# Patient Record
Sex: Male | Born: 1965
Health system: Southern US, Community
[De-identification: ages and names within clinical notes are randomized; demographics above are authoritative.]

## PROBLEM LIST (undated history)

## (undated) ENCOUNTER — Emergency Department (HOSPITAL_BASED_OUTPATIENT_CLINIC_OR_DEPARTMENT_OTHER): Admission: EM | Payer: BC Managed Care – PPO | Source: Home / Self Care

## (undated) DIAGNOSIS — H05049 Tenonitis of unspecified orbit: Secondary | ICD-10-CM

## (undated) HISTORY — DX: Tenonitis of unspecified orbit: H05.049

## (undated) HISTORY — PX: EYE SURGERY: SHX253

---

## 2008-01-14 ENCOUNTER — Ambulatory Visit: Payer: Self-pay | Admitting: Family Medicine

## 2008-01-14 ENCOUNTER — Encounter: Admission: RE | Admit: 2008-01-14 | Discharge: 2008-01-14 | Payer: Self-pay | Admitting: Family Medicine

## 2008-01-14 DIAGNOSIS — M25519 Pain in unspecified shoulder: Secondary | ICD-10-CM

## 2008-01-14 LAB — CONVERTED CEMR LAB
AST: 22 units/L (ref 0–37)
Albumin: 4.5 g/dL (ref 3.5–5.2)
Alkaline Phosphatase: 61 units/L (ref 39–117)
BUN: 14 mg/dL (ref 6–23)
HDL: 55 mg/dL (ref >39)
LDL Cholesterol: 113 mg/dL — ABNORMAL HIGH (ref 0–99)
PSA: 0.72 ng/mL (ref 0.10–4.00)
Potassium: 4.8 meq/L (ref 3.5–5.3)
Sodium: 142 meq/L (ref 135–145)

## 2008-01-15 ENCOUNTER — Encounter: Payer: Self-pay | Admitting: Family Medicine

## 2008-12-01 ENCOUNTER — Ambulatory Visit: Payer: Self-pay | Admitting: Family Medicine

## 2008-12-01 DIAGNOSIS — R17 Unspecified jaundice: Secondary | ICD-10-CM | POA: Insufficient documentation

## 2008-12-01 DIAGNOSIS — K3189 Other diseases of stomach and duodenum: Secondary | ICD-10-CM

## 2008-12-01 DIAGNOSIS — R1013 Epigastric pain: Secondary | ICD-10-CM

## 2008-12-01 LAB — CONVERTED CEMR LAB
Nitrite: NEGATIVE
Specific Gravity, Urine: 1.025
WBC Urine, dipstick: NEGATIVE

## 2008-12-02 ENCOUNTER — Encounter: Payer: Self-pay | Admitting: Family Medicine

## 2008-12-02 DIAGNOSIS — R748 Abnormal levels of other serum enzymes: Secondary | ICD-10-CM | POA: Insufficient documentation

## 2008-12-02 LAB — CONVERTED CEMR LAB
ALT: 150 units/L — ABNORMAL HIGH (ref 0–53)
Alkaline Phosphatase: 131 units/L — ABNORMAL HIGH (ref 39–117)
Basophils Absolute: 0 10*3/uL (ref 0.0–0.1)
Basophils Relative: 0 % (ref 0–1)
MCHC: 34 g/dL (ref 30.0–36.0)
Neutro Abs: 3.2 10*3/uL (ref 1.7–7.7)
Neutrophils Relative %: 62 % (ref 43–77)
Platelets: 264 10*3/uL (ref 150–400)
RBC: 5.92 M/uL — ABNORMAL HIGH (ref 4.22–5.81)
RDW: 26.4 % — ABNORMAL HIGH (ref 11.5–15.5)
Sodium: 138 meq/L (ref 135–145)
Total Bilirubin: 7.3 mg/dL — ABNORMAL HIGH (ref 0.3–1.2)
Total Protein: 7.4 g/dL (ref 6.0–8.3)

## 2008-12-03 LAB — CONVERTED CEMR LAB
HCV Ab: NEGATIVE
Hep A IgM: NEGATIVE

## 2008-12-04 ENCOUNTER — Encounter: Admission: RE | Admit: 2008-12-04 | Discharge: 2008-12-04 | Payer: Self-pay | Admitting: Family Medicine

## 2008-12-08 ENCOUNTER — Telehealth: Payer: Self-pay | Admitting: Gastroenterology

## 2008-12-08 ENCOUNTER — Encounter: Payer: Self-pay | Admitting: Family Medicine

## 2008-12-09 LAB — CONVERTED CEMR LAB
Albumin: 4.3 g/dL (ref 3.5–5.2)
Alkaline Phosphatase: 157 units/L — ABNORMAL HIGH (ref 39–117)
Indirect Bilirubin: 3.4 mg/dL — ABNORMAL HIGH (ref 0.0–0.9)
Total Bilirubin: 7.8 mg/dL — ABNORMAL HIGH (ref 0.3–1.2)

## 2008-12-10 ENCOUNTER — Ambulatory Visit: Payer: Self-pay | Admitting: Gastroenterology

## 2008-12-10 DIAGNOSIS — K219 Gastro-esophageal reflux disease without esophagitis: Secondary | ICD-10-CM

## 2008-12-10 DIAGNOSIS — Z862 Personal history of diseases of the blood and blood-forming organs and certain disorders involving the immune mechanism: Secondary | ICD-10-CM | POA: Insufficient documentation

## 2008-12-10 DIAGNOSIS — Z8639 Personal history of other endocrine, nutritional and metabolic disease: Secondary | ICD-10-CM

## 2008-12-10 DIAGNOSIS — R718 Other abnormality of red blood cells: Secondary | ICD-10-CM

## 2008-12-10 DIAGNOSIS — L299 Pruritus, unspecified: Secondary | ICD-10-CM | POA: Insufficient documentation

## 2008-12-24 ENCOUNTER — Encounter: Payer: Self-pay | Admitting: Gastroenterology

## 2008-12-28 LAB — CONVERTED CEMR LAB
ALT: 76 units/L — ABNORMAL HIGH (ref 0–53)
Bilirubin, Direct: 3 mg/dL — ABNORMAL HIGH (ref 0.0–0.3)
Indirect Bilirubin: 2.6 mg/dL — ABNORMAL HIGH (ref 0.0–0.9)
Total Bilirubin: 5.6 mg/dL — ABNORMAL HIGH (ref 0.3–1.2)

## 2008-12-31 ENCOUNTER — Encounter: Payer: Self-pay | Admitting: Family Medicine

## 2009-01-04 LAB — CONVERTED CEMR LAB
ALT: 100 units/L — ABNORMAL HIGH (ref 0–53)
AST: 53 units/L — ABNORMAL HIGH (ref 0–37)
Albumin: 4.1 g/dL (ref 3.5–5.2)
Alkaline Phosphatase: 168 units/L — ABNORMAL HIGH (ref 39–117)
Bilirubin, Direct: 1.7 mg/dL — ABNORMAL HIGH (ref 0.0–0.3)
Indirect Bilirubin: 1.8 mg/dL — ABNORMAL HIGH (ref 0.0–0.9)
Total Bilirubin: 3.5 mg/dL — ABNORMAL HIGH (ref 0.3–1.2)
Total Protein: 7.1 g/dL (ref 6.0–8.3)

## 2009-01-05 ENCOUNTER — Ambulatory Visit: Payer: Self-pay | Admitting: Gastroenterology

## 2009-01-05 DIAGNOSIS — K759 Inflammatory liver disease, unspecified: Secondary | ICD-10-CM | POA: Insufficient documentation

## 2009-02-02 ENCOUNTER — Encounter: Payer: Self-pay | Admitting: Family Medicine

## 2009-02-03 LAB — CONVERTED CEMR LAB
ALT: 37 units/L (ref 0–53)
AST: 28 units/L (ref 0–37)
Albumin: 4.5 g/dL (ref 3.5–5.2)
Alkaline Phosphatase: 117 units/L (ref 39–117)
Total Bilirubin: 1.1 mg/dL (ref 0.3–1.2)
Total Protein: 7 g/dL (ref 6.0–8.3)

## 2009-02-16 ENCOUNTER — Ambulatory Visit: Payer: Self-pay | Admitting: Gastroenterology

## 2010-02-01 NOTE — Assessment & Plan Note (Signed)
Summary: F/U Elevated LFT's, saw NP   History of Present Illness Visit Type: Follow-up Visit Primary GI MD: Sheryn Bison MD FACP FAGA Primary Provider: Seymour Bars DO Requesting Provider: Seymour Bars DO Chief Complaint: abnormal liver function tests History of Present Illness:   This patient is a 45 year old African American male with cholestatic hepatitis felt secondary to use of protein supplements and perhaps testosterone steroid use for muscle building. His cholestasis has gradually improved as has his dark urine, clay-colored stools, and icterus. He continues with rather marked pruritis, and he could not tolerate pentazocine-naloxone. He also in the past he has taken omeprazole for acid reflux but denies reflux symptoms at this time and he says that he is taking no medications at all. He denies nausea vomiting, anorexia, weight loss, fever chills, muscle pains, metal status changes, or systemic complaints.  Upper abdominal ultrasound exam is unremarkable. Family history is noncontributory.  Repeat liver function tests have continued to improve and he  continues with slight elevation of transaminase levels. Lab data otherwise was reviewed today and is unremarkable.   GI Review of Systems    Reports acid reflux.      Denies abdominal pain, belching, bloating, chest pain, dysphagia with liquids, dysphagia with solids, heartburn, loss of appetite, nausea, vomiting, vomiting blood, weight loss, and  weight gain.        Denies anal fissure, black tarry stools, change in bowel habit, constipation, diarrhea, diverticulosis, fecal incontinence, heme positive stool, hemorrhoids, irritable bowel syndrome, jaundice, light color stool, liver problems, rectal bleeding, and  rectal pain.    Current Medications (verified): 1)  Mega Multi Men  Cr-Tabs (Multiple Vitamins-Minerals) .... Take 1 Tablet By Mouth Once A Day 2)  Digestive Advantage .Marland Kitchen.. 2 By Mouth Once Daily 3)  Omeprazole 20 Mg Cpdr  (Omeprazole) .... Take 1 Tab 30 Min Prior To Breakfast 4)  Pentazocine-Naloxone Hcl 50-0.5 Mg Tabs (Pentazocine-Naloxone) .... Take 1 Tab Twice Daily As Needed For Itching.  Allergies (verified): No Known Drug Allergies  Past History:  Past medical, surgical, family and social histories (including risk factors) reviewed for relevance to current acute and chronic problems.  Past Medical History: Reviewed history from 01/14/2008 and no changes required. AC tenonitis  Past Surgical History: Reviewed history from 01/14/2008 and no changes required. none  Family History: Reviewed history from 01/14/2008 and no changes required. mother, father and 2 sisters alive and healthy  Social History: Reviewed history from 01/14/2008 and no changes required. 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   Never smoked. Occas ETOH. Works out regularly, wt stable  Review of Systems       The patient complains of allergy/sinus and itching.  The patient denies anemia, anxiety-new, arthritis/joint pain, back pain, blood in urine, breast changes/lumps, change in vision, confusion, cough, coughing up blood, depression-new, fainting, fatigue, fever, headaches-new, hearing problems, heart murmur, heart rhythm changes, menstrual pain, muscle pains/cramps, night sweats, nosebleeds, pregnancy symptoms, shortness of breath, sleeping problems, sore throat, swelling of feet/legs, swollen lymph glands, thirst - excessive , urination - excessive , urination changes/pain, urine leakage, vision changes, and voice change.         Continued neuritis and decreased sex drive.  Vital Signs:  Patient profile:   45 year old male Height:      68.5 inches Weight:      206.50 pounds BMI:     31.05 Pulse rate:   68 / minute Pulse rhythm:   regular BP sitting:   124 /  72  (left arm) Cuff size:   regular  Vitals Entered By: June McMurray CMA Duncan Dull) (January 05, 2009 1:55 PM)  Physical Exam  General:  Well  developed, well nourished, no acute distress.healthy appearing.   Head:  Normocephalic and atraumatic. Eyes:  PERRLA, no icterus.he That has mild scleral icterus. Lungs:  Clear throughout to auscultation. Heart:  Regular rate and rhythm; no murmurs, rubs,  or bruits. Abdomen:  Soft, nontender and nondistended. No masses, hepatosplenomegaly or hernias noted. Normal bowel sounds. Extremities:  No clubbing, cyanosis, edema or deformities noted. Neurologic:  Alert and  oriented x4;  grossly normal neurologically. Psych:  Alert and cooperative. Normal mood and affect.   Impression & Recommendations:  Problem # 1:  UNSPECIFIED HEPATITIS (ICD-573.3) Assessment Improved His clinical course and history are all consistent with cholestatic hepatitis related to protein supplements as previously outlined. He continues to improve as does his cholestasis. I recommend a repeat liver function tests in one month with GI followup in 6 weeks. I've given him Atarax 25 mg to use a bedtime to help him with his pruritus. I agree that we should continue to avoid as many drugs as possible including acetaminophen.  Problem # 2:  PRURITUS (ICD-698.9) Assessment: Improved this will continue to improve hopefully as his icterus and cholestasis resolve  Problem # 3:  GERD (ICD-530.81) Assessment: Improved p.r.n. use of omeprazole as tolerated is acceptable.  Other Orders: Future Orders: TLB-Hepatic/Liver Function Pnl (80076-HEPATIC) ... 02/02/2009  Patient Instructions: 1)  Copy sent to : Dr. Seymour Bars 2)  Please schedule a follow-up appointment in 6 to 8 weeks. Atarax 25 mg at bedtime as tolerated for pruritis. 3)  Repeat labs in one month.  To be drawn at Dr. Ovidio Kin office. 4)  The medication list was reviewed and reconciled.  All changed / newly prescribed medications were explained.  A complete medication list was provided to the patient / caregiver. Prescriptions: VISTARIL 25 MG CAPS (HYDROXYZINE PAMOATE)  1 by mouth q hs  #30 x 2   Entered by:   Ashok Cordia RN   Authorized by:   Mardella Layman MD [facility]   Signed by:   Ashok Cordia RN on 01/05/2009   Method used:   Electronically to        [facility] [phone]* (retail)       [address] [place], [place]  [address]       Ph: [phone]       Fax: [phone]   RxID:   [id number]

## 2010-02-01 NOTE — Assessment & Plan Note (Signed)
Summary: 6 WEEK APPT...LSW.   History of Present Illness Visit Type: Follow-up Visit Primary GI MD: Sheryn Bison MD FACP FAGA Primary Provider: Seymour Bars, DO Requesting Provider: Seymour Bars DO Chief Complaint: Patient here fror 6 week f/u for increased LFT'S. He denies any GI symptoms at this time. History of Present Illness:   His liver function tests returned to normal and he is completely asymptomatic. Repeat enzymes reviewed from Dr. Cathey Endow. Patient denies using any further bodybuilding-protein supplements or over-the-counter medications.   GI Review of Systems      Denies abdominal pain, acid reflux, belching, bloating, chest pain, dysphagia with liquids, dysphagia with solids, heartburn, loss of appetite, nausea, vomiting, vomiting blood, weight loss, and  weight gain.        Denies anal fissure, black tarry stools, change in bowel habit, constipation, diarrhea, diverticulosis, fecal incontinence, heme positive stool, hemorrhoids, irritable bowel syndrome, jaundice, light color stool, liver problems, rectal bleeding, and  rectal pain.    Current Medications (verified): 1)  None  Allergies (verified): No Known Drug Allergies  Past History:  Past medical, surgical, family and social histories (including risk factors) reviewed for relevance to current acute and chronic problems.  Past Medical History: Reviewed history from 01/14/2008 and no changes required. AC tenonitis  Past Surgical History: Reviewed history from 01/14/2008 and no changes required. none  Family History: Reviewed history from 01/14/2008 and no changes required. mother, father and 2 sisters alive and healthy No FH of Colon Cancer:  Social History: Reviewed history from 01/14/2008 and no changes required. 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   Never smoked.  ETOH.-rare Works out regularly, wt stable  Review of Systems       The patient complains of allergy/sinus.  The  patient denies anemia, anxiety-new, arthritis/joint pain, back pain, blood in urine, breast changes/lumps, change in vision, confusion, cough, coughing up blood, depression-new, fainting, fatigue, fever, headaches-new, hearing problems, heart murmur, heart rhythm changes, itching, menstrual pain, muscle pains/cramps, night sweats, nosebleeds, pregnancy symptoms, shortness of breath, skin rash, sleeping problems, sore throat, swelling of feet/legs, swollen lymph glands, thirst - excessive , urination - excessive , urination changes/pain, urine leakage, vision changes, and voice change.    Vital Signs:  Patient profile:   45 year old male Height:      68.5 inches Weight:      2042 pounds BMI:     307.07 BSA:     5.52 Pulse rate:   80 / minute Pulse rhythm:   regular BP sitting:   94 / 58  (right arm)  Vitals Entered By: Hortense Ramal CMA Duncan Dull) (February 16, 2009 1:44 PM)  Physical Exam  General:  Well developed, well nourished, no acute distress.healthy appearing.   Head:  Normocephalic and atraumatic. Eyes:  PERRLA, no icterus.exam deferred to patient's ophthalmologist.   Abdomen:  Soft, nontender and nondistended. No masses, hepatosplenomegaly or hernias noted. Normal bowel sounds. Neurologic:  Alert and  oriented x4;  grossly normal neurologically. Psych:  Alert and cooperative. Normal mood and affect.   Impression & Recommendations:  Problem # 1:  UNSPECIFIED HEPATITIS (ICD-573.3) Assessment Improved Drug-Induced cholestatic hepatitis has completely resolved and he is asymptomatic, and there is no evidence of hepatosplenomegaly. I have cautioned him about using any medications without checking with his physicians beforehand. We will follow him with Dr. Cathey Endow have urged him to return to his normal activities and a healthy diet with a healthy lifestyle.  Patient Instructions: 1)  Copy sent  to : Dr. Seymour Bars and Wanda Plump nurse practitioner at [facility] 2)  Please  schedule a follow-up appointment as needed.

## 2010-08-08 ENCOUNTER — Encounter: Payer: Self-pay | Admitting: Family Medicine

## 2010-08-09 ENCOUNTER — Encounter: Payer: Self-pay | Admitting: Family Medicine

## 2010-08-09 ENCOUNTER — Ambulatory Visit (INDEPENDENT_AMBULATORY_CARE_PROVIDER_SITE_OTHER): Payer: BC Managed Care – PPO | Admitting: Family Medicine

## 2010-08-09 DIAGNOSIS — Z13220 Encounter for screening for lipoid disorders: Secondary | ICD-10-CM

## 2010-08-09 DIAGNOSIS — Z130 Encounter for screening for diseases of the blood and blood-forming organs and certain disorders involving the immune mechanism: Secondary | ICD-10-CM

## 2010-08-09 DIAGNOSIS — R5381 Other malaise: Secondary | ICD-10-CM

## 2010-08-09 DIAGNOSIS — R5383 Other fatigue: Secondary | ICD-10-CM

## 2010-08-09 LAB — CBC WITH DIFFERENTIAL/PLATELET
Basophils Relative: 1 % (ref 0–1)
Eosinophils Absolute: 0.1 10*3/uL (ref 0.0–0.7)
Eosinophils Relative: 3 % (ref 0–5)
HCT: 46.1 % (ref 39.0–52.0)
Hemoglobin: 14.9 g/dL (ref 13.0–17.0)
Lymphs Abs: 1.9 10*3/uL (ref 0.7–4.0)
MCH: 26.2 pg (ref 26.0–34.0)
MCHC: 32.3 g/dL (ref 30.0–36.0)
MCV: 81.2 fL (ref 78.0–100.0)
Monocytes Absolute: 0.2 10*3/uL (ref 0.1–1.0)
Monocytes Relative: 4 % (ref 3–12)
RBC: 5.68 MIL/uL (ref 4.22–5.81)

## 2010-08-09 LAB — LIPID PANEL
Cholesterol: 176 mg/dL (ref 0–200)
HDL: 53 mg/dL (ref >39)
Total CHOL/HDL Ratio: 3.3 Ratio

## 2010-08-09 NOTE — Progress Notes (Signed)
  Subjective:    Patient ID: Joel Perry, male    DOB: Aug 08, 1965, 45 y.o.   MRN: [id number]  HPI  45 yo AAM presents for f/u visit with fasting labs.  He had elevated liver enzymes last year but they normalized off supplements.  He feels more tired lately but is not getting adequate sleep.  His wife complains of his low sex drive.  He is exercising regularly.  No chest pain or SOB.  Denies feeling depressed or anxious.  He only needs TUMS every now and then for reflux but not everyday.  BP 110/72  Pulse 79  Ht 5\' 8"  (1.727 m)  Wt 216 lb (97.977 kg)  BMI 32.84 kg/m2  SpO2 97%   Review of Systems  Constitutional: Negative for fatigue and unexpected weight change.  Respiratory: Negative for shortness of breath.   Cardiovascular: Negative for chest pain, palpitations and leg swelling.  Genitourinary: Negative for difficulty urinating.  Psychiatric/Behavioral: Positive for sleep disturbance. Negative for dysphoric mood. The patient is not nervous/anxious.        Objective:   Physical Exam  Constitutional: He appears well-developed and well-nourished.  HENT:  Mouth/Throat: Oropharynx is clear and moist.  Eyes: No scleral icterus.  Neck: No thyromegaly present.  Cardiovascular: Normal rate, regular rhythm, normal heart sounds and intact distal pulses.   No murmur heard. Pulmonary/Chest: Effort normal and breath sounds normal.  Musculoskeletal: He exhibits no edema.  Skin: Skin is warm and dry.  Psychiatric: He has a normal mood and affect.          Assessment & Plan:  Fatigue/ low libido:  Due for fasting labs today.  Will include TSH and testosterone levels today and f/u results tomorrow.  Discussed the importance of adequate sleep. He is taking a MVI daily.

## 2010-08-09 NOTE — Patient Instructions (Signed)
Labs today. Will call you w/ results tomorrow.    Return for a PHYSICAL in 4 mos.

## 2010-08-10 LAB — COMPLETE METABOLIC PANEL WITHOUT GFR
ALT: 32 U/L (ref 0–53)
AST: 38 U/L — ABNORMAL HIGH (ref 0–37)
Albumin: 4.5 g/dL (ref 3.5–5.2)
Alkaline Phosphatase: 71 U/L (ref 39–117)
BUN: 22 mg/dL (ref 6–23)
CO2: 24 meq/L (ref 19–32)
Calcium: 9.5 mg/dL (ref 8.4–10.5)
Chloride: 102 meq/L (ref 96–112)
Creat: 1.29 mg/dL (ref 0.50–1.35)
GFR, Est African American: >60 mL/min (ref >60)
GFR, Est Non African American: >60 mL/min (ref >60)
Glucose, Bld: 91 mg/dL (ref 70–99)
Potassium: 4.2 meq/L (ref 3.5–5.3)
Sodium: 137 meq/L (ref 135–145)
Total Bilirubin: 0.6 mg/dL (ref 0.3–1.2)
Total Protein: 7.2 g/dL (ref 6.0–8.3)

## 2010-08-10 LAB — TESTOSTERONE, FREE, TOTAL, SHBG
Sex Hormone Binding: 24 nmol/L (ref 13–71)
Testosterone, Free: 107.6 pg/mL (ref 47.0–244.0)
Testosterone-% Free: 2.4 % (ref 1.6–2.9)
Testosterone: 440.45 ng/dL (ref 250–890)

## 2010-08-11 ENCOUNTER — Telehealth: Payer: Self-pay | Admitting: Family Medicine

## 2010-08-11 NOTE — Telephone Encounter (Signed)
Pt notified of results. KJ LPN 

## 2010-08-11 NOTE — Telephone Encounter (Signed)
Pt called and is wanting his results (labs). Plan:  Pt notified with the results. Jarvis Newcomer, LPN Domingo Dimes

## 2010-08-11 NOTE — Telephone Encounter (Signed)
Pls let pt know that his testosterone level came back perfect.  Thyroid function, blood counts, fasting sugar, liver and kidney function also normal.  Cholesterol is at goal.  Keep up the good work.  Cannot add testosterone RX because he is already normal.

## 2010-12-16 ENCOUNTER — Encounter: Payer: Self-pay | Admitting: Family Medicine

## 2010-12-16 ENCOUNTER — Ambulatory Visit (INDEPENDENT_AMBULATORY_CARE_PROVIDER_SITE_OTHER): Payer: BC Managed Care – PPO | Admitting: Family Medicine

## 2010-12-16 VITALS — BP 102/61 | HR 67 | Ht 69.5 in | Wt 218.0 lb

## 2010-12-16 DIAGNOSIS — R748 Abnormal levels of other serum enzymes: Secondary | ICD-10-CM

## 2010-12-16 DIAGNOSIS — Z0000 Encounter for general adult medical examination without abnormal findings: Secondary | ICD-10-CM

## 2010-12-16 DIAGNOSIS — Z23 Encounter for immunization: Secondary | ICD-10-CM

## 2010-12-16 DIAGNOSIS — R7402 Elevation of levels of lactic acid dehydrogenase (LDH): Secondary | ICD-10-CM

## 2010-12-16 NOTE — Progress Notes (Signed)
  Subjective:    Patient ID: Joel Perry, male    DOB: 01/25/1965, 45 y.o.   MRN: [id number]  HPI Here for CPE. No complaints.    Review of Systems comprehensiv eROS is neg  BP 102/61  Pulse 67  Ht 5' 9.5" (1.765 m)  Wt 218 lb (98.884 kg)  BMI 31.73 kg/m2    No Known Allergies  Past Medical History  Diagnosis Date  . Tenonitis     of AC    History reviewed. No pertinent past surgical history.  History   Social History  . Marital Status: Married    Spouse Name: Okey Regal    Number of Children: 2  . Years of Education: N/A   Occupational History  . 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    Social History Main Topics  . Smoking status: Never Smoker   . Smokeless tobacco: Not on file  . Alcohol Use: 0.0 oz/week    0 drink(s) per week     rare  . Drug Use: No  . Sexually Active: Not on file   Other Topics Concern  . Not on file   Social History Narrative   Works out regularly every other day.  Some caffeine.      History reviewed. No pertinent family history.     Objective:   Physical Exam  Constitutional: He is oriented to person, place, and time. He appears well-developed and well-nourished.  HENT:  Head: Normocephalic and atraumatic.  Right Ear: External ear normal.  Left Ear: External ear normal.  Nose: Nose normal.  Mouth/Throat: Oropharynx is clear and moist.  Eyes: Conjunctivae and EOM are normal. Pupils are equal, round, and reactive to light.  Neck: Normal range of motion. Neck supple. No thyromegaly present.  Cardiovascular: Normal rate, regular rhythm, normal heart sounds and intact distal pulses.   Pulmonary/Chest: Effort normal and breath sounds normal.  Abdominal: Soft. Bowel sounds are normal. He exhibits no distension and no mass. There is no tenderness. There is no rebound and no guarding.  Musculoskeletal: Normal range of motion.  Lymphadenopathy:    He has no cervical adenopathy.  Neurological: He is alert and oriented to person,  place, and time. He has normal reflexes.  Skin: Skin is warm and dry.  Psychiatric: He has a normal mood and affect. His behavior is normal. Judgment and thought content normal.          Assessment & Plan:  CPE- complete physical examination. Start a regular exercise program and make sure you are eating a healthy diet Try to eat 4 servings of dairy a day or take a calcium supplement (500mg  twice a day). Your vaccines are up to date.  Had labs in August. Will check irect LDL and liver enzymes. Had abnormal AST in August.  Tdap and flu vac given today.

## 2010-12-16 NOTE — Patient Instructions (Signed)
Keep up your regular exercise program and make sure you are eating a healthy diet Try to eat 4 servings of dairy a day or take a calcium supplement (500mg twice a day). Your vaccines are up to date.   

## 2010-12-22 LAB — HEPATIC FUNCTION PANEL
ALT: 28 U/L (ref 0–53)
AST: 31 U/L (ref 0–37)
Albumin: 4.6 g/dL (ref 3.5–5.2)
Alkaline Phosphatase: 70 U/L (ref 39–117)
Bilirubin, Direct: 0.1 mg/dL (ref 0.0–0.3)
Total Bilirubin: 0.5 mg/dL (ref 0.3–1.2)

## 2011-07-03 ENCOUNTER — Encounter: Payer: Self-pay | Admitting: Physician Assistant

## 2011-07-03 ENCOUNTER — Ambulatory Visit (INDEPENDENT_AMBULATORY_CARE_PROVIDER_SITE_OTHER): Payer: BC Managed Care – PPO | Admitting: Physician Assistant

## 2011-07-03 ENCOUNTER — Ambulatory Visit (INDEPENDENT_AMBULATORY_CARE_PROVIDER_SITE_OTHER): Payer: BC Managed Care – PPO

## 2011-07-03 VITALS — BP 126/77 | HR 73 | Ht 69.5 in | Wt 217.0 lb

## 2011-07-03 DIAGNOSIS — M25569 Pain in unspecified knee: Secondary | ICD-10-CM

## 2011-07-03 DIAGNOSIS — M25561 Pain in right knee: Secondary | ICD-10-CM

## 2011-07-03 MED ORDER — MELOXICAM 7.5 MG PO TABS: 7.5000 mg | ORAL_TABLET | Freq: Every day | ORAL | Status: DC

## 2011-07-03 NOTE — Progress Notes (Signed)
  Subjective:    Patient ID: Joel Perry, male    DOB: September 26, 1965, 46 y.o.   MRN: [id number]  HPI Pt has had right knee pain for 3 weeks. He can't remember any injury or time when pain started. He has heard no pop or click. He takes aleve but feels like it is not helping. He has not tried anything other than aleve. He can bear weight but sometimes feels like his leg is going to give way. Walking on it makes it feel worse. Resting makes it feel the best.     Review of Systems     Objective:   Physical Exam  Constitutional: He is oriented to person, place, and time. He appears well-developed and well-nourished.  Musculoskeletal:       Joint tenderness to palpation on medial side of right knee no pain with palpation of the lateral joint space. ROM is normal. MCMurrays there was pain on medial side but no click was heard. Anterior drawer was negative. Some swelling noted around patella.   Neurological: He is alert and oriented to person, place, and time.  Psychiatric: He has a normal mood and affect. His behavior is normal.          Assessment & Plan:  Right knee pain- Will get x-rays. Gave mobic to take daily for knee pain. Encouraged to ice and rest at night. Get a flexible knee brace to wear at all times. Gave handout on causes of knee pain. Discussed possibility of meniscus tears. Will get MRI to evaluate if pain is still present in a week.

## 2011-07-03 NOTE — Patient Instructions (Addendum)
Mobic daily. Ice for 20 minutes at a time. Will call with x-ray of right knee. Wear flexible brace. Will consider MRI if not improving in the next week.  Knee Pain The knee is the complex joint between your thigh and your lower leg. It is made up of bones, tendons, ligaments, and cartilage. The bones that make up the knee are:  The femur in the thigh.   The tibia and fibula in the lower leg.   The patella or kneecap riding in the groove on the lower femur.  CAUSES  Knee pain is a common complaint with many causes. A few of these causes are:  Injury, such as:   A ruptured ligament or tendon injury.   Torn cartilage.   Medical conditions, such as:   Gout   Arthritis   Infections   Overuse, over training or overdoing a physical activity.  Knee pain can be minor or severe. Knee pain can accompany debilitating injury. Minor knee problems often respond well to self-care measures or get well on their own. More serious injuries may need medical intervention or even surgery. SYMPTOMS The knee is complex. Symptoms of knee problems can vary widely. Some of the problems are:  Pain with movement and weight bearing.   Swelling and tenderness.   Buckling of the knee.   Inability to straighten or extend your knee.   Your knee locks and you cannot straighten it.   Warmth and redness with pain and fever.   Deformity or dislocation of the kneecap.  DIAGNOSIS  Determining what is wrong may be very straight forward such as when there is an injury. It can also be challenging because of the complexity of the knee. Tests to make a diagnosis may include:  Your caregiver taking a history and doing a physical exam.   Routine X-rays can be used to rule out other problems. X-rays will not reveal a cartilage tear. Some injuries of the knee can be diagnosed by:   Arthroscopy a surgical technique by which a small video camera is inserted through tiny incisions on the sides of the knee. This  procedure is used to examine and repair internal knee joint problems. Tiny instruments can be used during arthroscopy to repair the torn knee cartilage (meniscus).   Arthrography is a radiology technique. A contrast liquid is directly injected into the knee joint. Internal structures of the knee joint then become visible on X-ray film.   An MRI scan is a non x-ray radiology procedure in which magnetic fields and a computer produce two- or three-dimensional images of the inside of the knee. Cartilage tears are often visible using an MRI scanner. MRI scans have largely replaced arthrography in diagnosing cartilage tears of the knee.   Blood work.   Examination of the fluid that helps to lubricate the knee joint (synovial fluid). This is done by taking a sample out using a needle and a syringe.  TREATMENT The treatment of knee problems depends on the cause. Some of these treatments are:  Depending on the injury, proper casting, splinting, surgery or physical therapy care will be needed.   Give yourself adequate recovery time. Do not overuse your joints. If you begin to get sore during workout routines, back off. Slow down or do fewer repetitions.   For repetitive activities such as cycling or running, maintain your strength and nutrition.   Alternate muscle groups. For example if you are a weight lifter, work the upper body on one day and the  lower body the next.   Either tight or weak muscles do not give the proper support for your knee. Tight or weak muscles do not absorb the stress placed on the knee joint. Keep the muscles surrounding the knee strong.   Take care of mechanical problems.   If you have flat feet, orthotics or special shoes may help. See your caregiver if you need help.   Arch supports, sometimes with wedges on the inner or outer aspect of the heel, can help. These can shift pressure away from the side of the knee most bothered by osteoarthritis.   A brace called an  "unloader" brace also may be used to help ease the pressure on the most arthritic side of the knee.   If your caregiver has prescribed crutches, braces, wraps or ice, use as directed. The acronym for this is PRICE. This means protection, rest, ice, compression and elevation.   Nonsteroidal anti-inflammatory drugs (NSAID's), can help relieve pain. But if taken immediately after an injury, they may actually increase swelling. Take NSAID's with food in your stomach. Stop them if you develop stomach problems. Do not take these if you have a history of ulcers, stomach pain or bleeding from the bowel. Do not take without your caregiver's approval if you have problems with fluid retention, heart failure, or kidney problems.   For ongoing knee problems, physical therapy may be helpful.   Glucosamine and chondroitin are over-the-counter dietary supplements. Both may help relieve the pain of osteoarthritis in the knee. These medicines are different from the usual anti-inflammatory drugs. Glucosamine may decrease the rate of cartilage destruction.   Injections of a corticosteroid drug into your knee joint may help reduce the symptoms of an arthritis flare-up. They may provide pain relief that lasts a few months. You may have to wait a few months between injections. The injections do have a small increased risk of infection, water retention and elevated blood sugar levels.   Hyaluronic acid injected into damaged joints may ease pain and provide lubrication. These injections may work by reducing inflammation. A series of shots may give relief for as long as 6 months.   Topical painkillers. Applying certain ointments to your skin may help relieve the pain and stiffness of osteoarthritis. Ask your pharmacist for suggestions. Many over the-counter products are approved for temporary relief of arthritis pain.   In some countries, doctors often prescribe topical NSAID's for relief of chronic conditions such as  arthritis and tendinitis. A review of treatment with NSAID creams found that they worked as well as oral medications but without the serious side effects.  PREVENTION  Maintain a healthy weight. Extra pounds put more strain on your joints.   Get strong, stay limber. Weak muscles are a common cause of knee injuries. Stretching is important. Include flexibility exercises in your workouts.   Be smart about exercise. If you have osteoarthritis, chronic knee pain or recurring injuries, you may need to change the way you exercise. This does not mean you have to stop being active. If your knees ache after jogging or playing basketball, consider switching to swimming, water aerobics or other low-impact activities, at least for a few days a week. Sometimes limiting high-impact activities will provide relief.   Make sure your shoes fit well. Choose footwear that is right for your sport.   Protect your knees. Use the proper gear for knee-sensitive activities. Use kneepads when playing volleyball or laying carpet. Buckle your seat belt every time you drive. Most  shattered kneecaps occur in car accidents.   Rest when you are tired.  SEEK MEDICAL CARE IF:  You have knee pain that is continual and does not seem to be getting better.  SEEK IMMEDIATE MEDICAL CARE IF:  Your knee joint feels hot to the touch and you have a high fever. MAKE SURE YOU:   Understand these instructions.   Will watch your condition.   Will get help right away if you are not doing well or get worse.  Document Released: 10/16/2006 Document Revised: 12/08/2010 Document Reviewed: 10/16/2006 [facility] Patient Information 2012 [place], [place].

## 2011-08-15 ENCOUNTER — Encounter: Payer: Self-pay | Admitting: Family Medicine

## 2011-08-15 ENCOUNTER — Ambulatory Visit (INDEPENDENT_AMBULATORY_CARE_PROVIDER_SITE_OTHER): Payer: BC Managed Care – PPO | Admitting: Family Medicine

## 2011-08-15 VITALS — BP 116/68 | HR 74 | Temp 97.6°F | Wt 214.0 lb

## 2011-08-15 DIAGNOSIS — S335XXA Sprain of ligaments of lumbar spine, initial encounter: Secondary | ICD-10-CM

## 2011-08-15 DIAGNOSIS — S39012A Strain of muscle, fascia and tendon of lower back, initial encounter: Secondary | ICD-10-CM

## 2011-08-15 MED ORDER — OXYCODONE-ACETAMINOPHEN 7.5-500 MG PO TABS: 1.0000 | ORAL_TABLET | Freq: Every evening | ORAL | Status: DC | PRN

## 2011-08-15 MED ORDER — DICLOFENAC SODIUM 75 MG PO TBEC: 75.0000 mg | DELAYED_RELEASE_TABLET | Freq: Two times a day (BID) | ORAL | Status: DC | PRN

## 2011-08-15 MED ORDER — CYCLOBENZAPRINE HCL 10 MG PO TABS: 10.0000 mg | ORAL_TABLET | Freq: Three times a day (TID) | ORAL | Status: DC | PRN

## 2011-08-15 NOTE — Patient Instructions (Signed)

## 2011-08-15 NOTE — Progress Notes (Signed)
CC: Joel Perry is a 46 y.o. male is here for Back Pain   Subjective: HPI:  Physically active male who presents to clinic for back pain. He tells on Sunday he was lifting weights, doing his usual routine, and upwardly bent to the side to put any weight down and felt felt immediate pain/pull sensation in his left lower back. He has had identical pain before but typically it goes away within one to 2 days. He describes the pain is at 8/10 at its worse. He describes it as a discomfort that he can't find a position which will alleviate the pain 100 percent. He denies any motor or sensory disturbances, no bowel or bladder dysfunction, no trouble walking, no coordination issues, nor any midline back pain. Pain seems to be worse after bending forward when he is trying to stand back up straight. Has been interfering with his sleep at night. He's tried Aleve with mild improvement and also tried a 10 mg tablet of Flexeril without that much improvement however he believes Flexeril is years old and possibly expired. He denies any GI disturbance, dysuria, change in the color or consistency of his urine, abdominal pain, nor breathing issues,.   Review Of Systems Outlined In HPI  Past Medical History  Diagnosis Date  . Tenonitis     of AC     No family history on file.   History  Substance Use Topics  . Smoking status: Never Smoker   . Smokeless tobacco: Not on file  . Alcohol Use: 0.0 oz/week    0 drink(s) per week     rare     Objective: Filed Vitals:   08/15/11 1316  BP: 116/68  Pulse: 74  Temp: 97.6 F (36.4 C)    General: Alert and Oriented, No Acute Distress Lungs:   Comfortable work of breathing. Good air movement. Cardiac: Regular rate and rhythm.  Abdomen:soft and non tender without palpable masses. Extremities: No peripheral edema.  Strong peripheral pulses. Full ROM and strength in bilateral lower extremities.  L4  & S1 DTRs 1/4 bilaterally.  SLR causes slight pain but no  longer present when patient distracted.  FABER negative. Back: No midline spinal tenderness, slight pain with palpation of bilateral SI joints, pain worse with palpation of low lumbar paraspinal musculature.  Stork test negative bilaterally. Mental Status: No depression, anxiety, nor agitation. Skin: Warm and dry.  Assessment & Plan: Joel Perry was seen today for back pain.  Diagnoses and associated orders for this visit:  Low back strain - diclofenac (VOLTAREN) 75 MG EC tablet; Take 1 tablet (75 mg total) by mouth 2 (two) times daily as needed. - cyclobenzaprine (FLEXERIL) 10 MG tablet; Take 1 tablet (10 mg total) by mouth 3 (three) times daily as needed for muscle spasms. - oxyCODONE-acetaminophen (PERCOCET) 7.5-500 MG per tablet; Take 1 tablet by mouth at bedtime as needed for pain.  Other Orders - naproxen sodium (ANAPROX) 220 MG tablet; Take 220 mg by mouth 2 (two) times daily with a meal.    Reassured patient that bony pathology was unlikely given lack of retroflex. Patient was in agreement about getting x-rays. Suspect soft tissue sprain/strain. Encouraged to use heat as needed, trial of using his weight belt while up and about, diclofenac and Skelaxin (if tolerated) during the day and Percocet as needed at night to help with sleep, sedative risks discussed. Skelaxin samples given if this is more effective than Flexeril I will provide a prescription for Skelaxin otherwise focus on  Flexeril. Home exercise plan for back rehabilitation exercises given. Advised to stay away from weight lifting until pain free for 3-5 days, then focus on high reps low weights rather than former routine.Marland KitchenMarland KitchenSigns and symptoms requring emergent/urgent reevaluation were discussed with the patient.  Return 1 week for if not improving.  Requested Prescriptions   Signed Prescriptions Disp Refills  . diclofenac (VOLTAREN) 75 MG EC tablet 60 tablet 0    Sig: Take 1 tablet (75 mg total) by mouth 2 (two) times daily as  needed.  . cyclobenzaprine (FLEXERIL) 10 MG tablet 30 tablet 1    Sig: Take 1 tablet (10 mg total) by mouth 3 (three) times daily as needed for muscle spasms.  Marland Kitchen oxyCODONE-acetaminophen (PERCOCET) 7.5-500 MG per tablet 25 tablet 0    Sig: Take 1 tablet by mouth at bedtime as needed for pain.

## 2011-08-17 ENCOUNTER — Telehealth: Payer: Self-pay | Admitting: *Deleted

## 2011-08-17 MED ORDER — METAXALONE 800 MG PO TABS: 800.0000 mg | ORAL_TABLET | Freq: Three times a day (TID) | ORAL | Status: AC | PRN

## 2011-11-13 ENCOUNTER — Encounter: Payer: Self-pay | Admitting: Family Medicine

## 2011-11-13 ENCOUNTER — Ambulatory Visit (INDEPENDENT_AMBULATORY_CARE_PROVIDER_SITE_OTHER): Payer: BC Managed Care – PPO | Admitting: Family Medicine

## 2011-11-13 VITALS — BP 121/67 | HR 70 | Ht 69.0 in | Wt 213.0 lb

## 2011-11-13 DIAGNOSIS — M25561 Pain in right knee: Secondary | ICD-10-CM

## 2011-11-13 DIAGNOSIS — B079 Viral wart, unspecified: Secondary | ICD-10-CM

## 2011-11-13 DIAGNOSIS — M25569 Pain in unspecified knee: Secondary | ICD-10-CM

## 2011-11-13 NOTE — Patient Instructions (Addendum)
Can schedule an appontment with Dr. Benjamin Stain for her right knee if it's not improving.  If we need to repeat the freezing on your wart I recommend 2 weeks.

## 2011-11-13 NOTE — Progress Notes (Signed)
  Subjective:    Patient ID: Joel Perry, male    DOB: 05-08-1965, 46 y.o.   MRN: [id number]  HPI Right knee pain for years.  Had normal xrays in July. No known old injuries.  Says it does swell from time to time.  Sitting and then trying to stand up if painful.  Walking up downstairs or up stairs is very painful.  Pianful to kneel on it.  Did ice, elevated and took NSAIDs without significan relief.  Pain is on the medial and lateral side of the knee.  Says he says he hyperextended one of his knees years ago but can't member which one.  Wart on finger.  Left index finger.  Has been there for months. Had it frozen years ago and responded well.     Review of Systems     Objective:   Physical Exam  Constitutional: He appears well-developed and well-nourished.  HENT:  Head: Normocephalic and atraumatic.  Abdominal: Soft. Bowel sounds are normal.  Musculoskeletal:       Right Knee with trace edema. He's tender over the medial joint line. Nontender over the patella. Nontender over the patellar tendon. He does have some increased laxity with anterior sore on the right knee compared to left. No crepitus. Normal range of motion. Strength is 5 out of 5. Negative McMurray's.  Skin:       He has a small wart on the proximal index finger on the left hand.  Psychiatric: He has a normal mood and affect. His behavior is normal.      Assessment and plan:  Right knee pain-I. suspect chondromalacia based on exam and history today. Especially with pain going down stairs and pain when he has been sitting for long periods or standing for long periods. It also swells intermittently. Given handout on exercises to start a home. Can also retry an anti-inflammatory he would like. He says today's x-ray today for his knee. It is not noticing some progressive the next 2-3 weeks I encouraged him to follow with my partner Dr. Benjamin Stain for further evaluation and treatment options.  [facility]- Discussed tx  options.  He opted for cryotherapy. Liquid nitrogen applied today patient tolerated well.  Cryotherapy Procedure Note  Pre-operative Diagnosis: wart  Post-operative Diagnosis: same  Locations: left hand  index finger  Indications: Irritated  Anesthesia: not required   Procedure Details  Patient informed of risks (permanent scarring, infection, light or dark discoloration, bleeding, infection, weakness, numbness and recurrence of the lesion) and benefits of the procedure and verbal informed consent obtained.  The areas are treated with liquid nitrogen therapy, frozen until ice ball extended 2 mm beyond lesion, allowed to thaw, and treated again. The patient tolerated procedure well.  The patient was instructed on post-op care, warned that there may be blister formation, redness and pain. Recommend OTC analgesia as needed for pain.  Condition: Stable  Complications: none.  Plan: 1. Instructed to keep the area dry and covered for 24-48h and clean thereafter. 2. Warning signs of infection were reviewed.   3. Recommended that the patient use OTC ibuprofen as needed for pain.  4. Return prn.

## 2012-07-30 ENCOUNTER — Ambulatory Visit (INDEPENDENT_AMBULATORY_CARE_PROVIDER_SITE_OTHER): Payer: BC Managed Care – PPO | Admitting: Family Medicine

## 2012-07-30 ENCOUNTER — Ambulatory Visit (INDEPENDENT_AMBULATORY_CARE_PROVIDER_SITE_OTHER): Payer: BC Managed Care – PPO

## 2012-07-30 ENCOUNTER — Encounter: Payer: Self-pay | Admitting: Family Medicine

## 2012-07-30 VITALS — BP 127/81 | HR 83 | Wt 224.0 lb

## 2012-07-30 DIAGNOSIS — M545 Low back pain, unspecified: Secondary | ICD-10-CM

## 2012-07-30 DIAGNOSIS — M25561 Pain in right knee: Secondary | ICD-10-CM

## 2012-07-30 DIAGNOSIS — M25569 Pain in unspecified knee: Secondary | ICD-10-CM

## 2012-07-30 MED ORDER — CYCLOBENZAPRINE HCL 10 MG PO TABS: 10.0000 mg | ORAL_TABLET | Freq: Every evening | ORAL | Status: DC | PRN

## 2012-07-30 MED ORDER — MELOXICAM 15 MG PO TABS: 15.0000 mg | ORAL_TABLET | Freq: Every day | ORAL | Status: DC

## 2012-07-30 NOTE — Progress Notes (Signed)
Subjective:    Patient ID: Joel Perry, male    DOB: July 12, 1965, 47 y.o.   MRN: [id number]  HPI Right knee pain for years. Had normal xrays in July. No known old injuries. Says it does swell from time to time. Sitting and then trying to stand up if painful. Walking up downstairs or up stairs is very painful. Pianful to kneel on it. Did ice, elevated and took NSAIDs without significan relief. Pain is on the medial and lateral side of the knee. Says he says he hyperextended  His right knee years ago while in the [employer] and had a tear of one of the tendons and a calf strain. We dx him with chondromalcia about 9 months ago and given exercise and NSAID.  Nto really much better. Still can't sit or stand for a long period. Will occ swell on the inner knee. Occ gives out on him.  Occ pops and sometimes painful.    Low Back pain on and off for over a years. HAs been since was in the [employer].  Flares occasionally.  sometimes just wakes up and is stiif and painful and will get muscle spasms.  Hx of 2 bulges discs. occ radiates into the left leg but rare.  Using Aleve occ but doesn't really help.  xrays were years ago.     Review of Systems BP 127/81  Pulse 83  Wt 224 lb (101.606 kg)  BMI 33.06 kg/m2    No Known Allergies  Past Medical History  Diagnosis Date  . Tenonitis     of AC    No past surgical history on file.  History   Social History  . Marital Status: Married    Spouse Name: Okey Regal    Number of Children: 2  . Years of Education: N/A   Occupational History  . 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    Social History Main Topics  . Smoking status: Never Smoker   . Smokeless tobacco: Not on file  . Alcohol Use: 0.0 oz/week    0 drink(s) per week     Comment: rare  . Drug Use: No  . Sexually Active: Not on file   Other Topics Concern  . Not on file   Social History Narrative   Works out regularly every other day.  Some caffeine.      No family history on  file.  Outpatient Encounter Prescriptions as of 07/30/2012  Medication Sig Dispense Refill  . cyclobenzaprine (FLEXERIL) 10 MG tablet Take 1 tablet (10 mg total) by mouth at bedtime as needed for muscle spasms.  30 tablet  0  . meloxicam (MOBIC) 15 MG tablet Take 1 tablet (15 mg total) by mouth daily.  30 tablet  1  . [DISCONTINUED] cyclobenzaprine (FLEXERIL) 10 MG tablet Take 1 tablet (10 mg total) by mouth 3 (three) times daily as needed for muscle spasms.  30 tablet  1  . [DISCONTINUED] diclofenac (VOLTAREN) 75 MG EC tablet Take 1 tablet (75 mg total) by mouth 2 (two) times daily as needed.  60 tablet  0  . [DISCONTINUED] naproxen sodium (ANAPROX) 220 MG tablet Take 220 mg by mouth 2 (two) times daily with a meal.      . [DISCONTINUED] oxyCODONE-acetaminophen (PERCOCET) 7.5-500 MG per tablet Take 1 tablet by mouth at bedtime as needed for pain.  25 tablet  0   No facility-administered encounter medications on file as of 07/30/2012.  Objective:   Physical Exam  Constitutional: He appears well-developed and well-nourished.  HENT:  Head: Normocephalic and atraumatic.  Musculoskeletal:  Right knee with no significant swelling. He does have some tenderness just above the medial joint line on the right knee. He has significant pain in that same area with full flexion. Hip, knee, ankle strength is 5 out of 5 bilaterally. Patellar reflexes 1+ bilaterally. Lumbar spine with decreased flexion. He has decreased mobility the lumbar spine with flexion. Also decreased extension. Decreased rotation right and left but symmetric. And fairly normal side bending right and left. Nontender over the lumbar spine but he is mildly tender over the paraspinous muscles bilaterally. No SI joint tenderness. Negative straight leg raise bilaterally.  He does have increased laxity with anterior drawer of the knee on the right compared to the left.  Skin: Skin is warm and dry.  Psychiatric: He has a normal mood  and affect. His behavior is normal.          Assessment & Plan:  Right knee Pain - Will refer to Dr. Karie Schwalbe for further evluation and tx.  is actually tender just above the joint lines of it's possible he has some tenderness over the medial collateral ligament. He does have some increased laxity is from an old hyperextension injury. He did have x-rays done last November that were fairly normal except for some edema. I would like him to see my partner for further evaluation and treatment.  Acute on chronic low back pain - recommend trial of muscle relaxers at bedtime. We'll change his anti-inflammatory to me that. He sees diclofenac and naproxen in the past. Offered to give him a small quantity of hydrocodone but he declined today. We will go ahead and get x-rays at the last week in [place] was several years ago. He definitely has some limited flexibility of the lumbar spine. This can be due muscle spasm versus arthritis versus his herniated discs. May need further evaluation with MRI if not continuing to improve.

## 2012-07-30 NOTE — Patient Instructions (Addendum)
Please schedule appt with Dr Karie Schwalbe.

## 2012-08-02 ENCOUNTER — Ambulatory Visit (INDEPENDENT_AMBULATORY_CARE_PROVIDER_SITE_OTHER): Payer: BC Managed Care – PPO | Admitting: Sports Medicine

## 2012-08-02 ENCOUNTER — Encounter: Payer: Self-pay | Admitting: Sports Medicine

## 2012-08-02 VITALS — BP 109/72 | HR 70 | Wt 224.0 lb

## 2012-08-02 DIAGNOSIS — M545 Low back pain, unspecified: Secondary | ICD-10-CM

## 2012-08-02 DIAGNOSIS — M25561 Pain in right knee: Secondary | ICD-10-CM

## 2012-08-02 DIAGNOSIS — M1711 Unilateral primary osteoarthritis, right knee: Secondary | ICD-10-CM | POA: Insufficient documentation

## 2012-08-02 DIAGNOSIS — M47816 Spondylosis without myelopathy or radiculopathy, lumbar region: Secondary | ICD-10-CM | POA: Insufficient documentation

## 2012-08-02 DIAGNOSIS — M25569 Pain in unspecified knee: Secondary | ICD-10-CM

## 2012-08-02 MED ORDER — PREDNISONE 50 MG PO TABS: ORAL_TABLET | ORAL | Status: DC

## 2012-08-02 MED ORDER — TRAMADOL HCL 50 MG PO TABS: 50.0000 mg | ORAL_TABLET | Freq: Three times a day (TID) | ORAL | Status: DC | PRN

## 2012-08-02 NOTE — Assessment & Plan Note (Signed)
After an injury years ago, now with a loose anterior cruciate ligament, as well as likely meniscal injury, and subsequent osteoarthritis. X-rays are negative, we do need an MRI. Knee brace, tramadol, home exercises. If no better in a month, we will inject the knee.

## 2012-08-02 NOTE — Progress Notes (Signed)
   Subjective:    I'm seeing this patient as a consultation for:  Dr. Linford Arnold  CC: Back pain and knee pain  HPI: Right knee pain: This is a very pleasant 47 year old male, he injured his knee decades ago. Since then he's had pain he localizes along the medial joint line associated with swelling, gelling, popping, but no locking or buckling. He really hasn't used any oral medications for this and hasn't been in the rehabilitation, he did have x-rays, the results of which will be dictated below. Pain is moderate, persistent.  Low back pain: Occasionally radiates down the lateral and posterior aspect of the left leg, worse when standing up straight, worse with Valsalva, better with leaning forward as when pushing a shopping cart. Not so bad when sitting in a car. No bowel or bladder dysfunction, no saddle anesthesia, no constitutional symptoms.  Past medical history, Surgical history, Family history not pertinant except as noted below, Social history, Allergies, and medications have been entered into the medical record, reviewed, and no changes needed.   Review of Systems: No headache, visual changes, nausea, vomiting, diarrhea, constipation, dizziness, abdominal pain, skin rash, fevers, chills, night sweats, weight loss, swollen lymph nodes, body aches, joint swelling, muscle aches, chest pain, shortness of breath, mood changes, visual or auditory hallucinations.   Objective:   General: Well Developed, well nourished, and in no acute distress.  Neuro/Psych: Alert and oriented x3, extra-ocular muscles intact, able to move all 4 extremities, sensation grossly intact. Skin: Warm and dry, no rashes noted.  Respiratory: Not using accessory muscles, speaking in full sentences, trachea midline.  Cardiovascular: Pulses palpable, no extremity edema. Abdomen: Does not appear distended. Right Knee: Minimal visible swelling, tender to palpation at the medial joint line. Some pain with terminal  flexion. ROM full in flexion and extension and lower leg rotation. Ligaments with solid consistent endpoints including PCL, LCL, MCL. There does feel to be loose endpoint on anterior cruciate ligament testing. Negative Mcmurray's, Apley's, and Thessalonian tests. Non painful patellar compression. Patellar glide without crepitus. Patellar and quadriceps tendons unremarkable. Hamstring and quadriceps strength is normal.  Back Exam:  Inspection: Unremarkable  Motion: Flexion 45 deg, Extension 45 deg, Side Bending to 45 deg bilaterally,  Rotation to 45 deg bilaterally  SLR laying: Negative  XSLR laying: Negative  Palpable tenderness: None. FABER: negative. Sensory change: Gross sensation intact to all lumbar and sacral dermatomes.  Reflexes: 2+ at both patellar tendons, 2+ at achilles tendons, Babinski's downgoing.  Strength at foot  Plantar-flexion: 5/5 Dorsi-flexion: 5/5 Eversion: 5/5 Inversion: 5/5  Leg strength  Quad: 5/5 Hamstring: 5/5 Hip flexor: 5/5 Hip abductors: 5/5  Gait unremarkable.  Knee x-rays are unremarkable with the exception of an effusion..  Lumbar spine x-rays are overall unremarkable as well.  Impression and Recommendations:   This case required medical decision making of moderate complexity.

## 2012-08-02 NOTE — Assessment & Plan Note (Signed)
Symptoms are suspicious for lumbar spinal stenosis. Prednisone, Flexeril at bedtime, tramadol as needed. Formal physical therapy. Return in 4 weeks, MRI for interventional injection planning if no better.

## 2012-08-05 ENCOUNTER — Telehealth: Payer: Self-pay | Admitting: *Deleted

## 2012-08-05 NOTE — Telephone Encounter (Signed)
Prior auth obtained for MRI right knee w/o contrast through [employer].  Auth # [id number] valid from 8/4 to 09/03/2012.  Fleet Contras notified in imaging in [place]. Barry Dienes, LPN

## 2012-08-10 ENCOUNTER — Ambulatory Visit (HOSPITAL_BASED_OUTPATIENT_CLINIC_OR_DEPARTMENT_OTHER)
Admission: RE | Admit: 2012-08-10 | Discharge: 2012-08-10 | Disposition: A | Discharge status: Home / Self Care | Payer: BC Managed Care – PPO | Source: Ambulatory Visit | Attending: Sports Medicine | Admitting: Sports Medicine

## 2012-08-10 DIAGNOSIS — M25561 Pain in right knee: Secondary | ICD-10-CM

## 2012-08-10 DIAGNOSIS — M25569 Pain in unspecified knee: Secondary | ICD-10-CM | POA: Insufficient documentation

## 2012-08-12 ENCOUNTER — Ambulatory Visit: Payer: BC Managed Care – PPO | Admitting: Physical Therapy

## 2012-08-12 DIAGNOSIS — M545 Low back pain: Secondary | ICD-10-CM

## 2012-08-12 DIAGNOSIS — M2560 Stiffness of unspecified joint, not elsewhere classified: Secondary | ICD-10-CM

## 2012-08-12 DIAGNOSIS — M6281 Muscle weakness (generalized): Secondary | ICD-10-CM

## 2012-08-15 ENCOUNTER — Encounter: Payer: BC Managed Care – PPO | Admitting: Physical Therapy

## 2012-08-15 DIAGNOSIS — M545 Low back pain: Secondary | ICD-10-CM

## 2012-08-15 DIAGNOSIS — M2560 Stiffness of unspecified joint, not elsewhere classified: Secondary | ICD-10-CM

## 2012-08-15 DIAGNOSIS — M6281 Muscle weakness (generalized): Secondary | ICD-10-CM

## 2012-08-19 ENCOUNTER — Encounter: Payer: BC Managed Care – PPO | Admitting: Physical Therapy

## 2012-08-19 DIAGNOSIS — M6281 Muscle weakness (generalized): Secondary | ICD-10-CM

## 2012-08-19 DIAGNOSIS — M2560 Stiffness of unspecified joint, not elsewhere classified: Secondary | ICD-10-CM

## 2012-08-19 DIAGNOSIS — M545 Low back pain: Secondary | ICD-10-CM

## 2012-08-22 ENCOUNTER — Encounter: Payer: BC Managed Care – PPO | Admitting: Physical Therapy

## 2012-08-22 DIAGNOSIS — M6281 Muscle weakness (generalized): Secondary | ICD-10-CM

## 2012-08-22 DIAGNOSIS — M2560 Stiffness of unspecified joint, not elsewhere classified: Secondary | ICD-10-CM

## 2012-08-22 DIAGNOSIS — M545 Low back pain, unspecified: Secondary | ICD-10-CM

## 2012-08-26 ENCOUNTER — Encounter (INDEPENDENT_AMBULATORY_CARE_PROVIDER_SITE_OTHER): Payer: Self-pay | Admitting: Physical Therapy

## 2012-08-26 DIAGNOSIS — M545 Low back pain, unspecified: Secondary | ICD-10-CM

## 2012-08-26 DIAGNOSIS — M6281 Muscle weakness (generalized): Secondary | ICD-10-CM

## 2012-08-26 DIAGNOSIS — M2560 Stiffness of unspecified joint, not elsewhere classified: Secondary | ICD-10-CM

## 2012-08-30 ENCOUNTER — Ambulatory Visit (INDEPENDENT_AMBULATORY_CARE_PROVIDER_SITE_OTHER): Payer: BC Managed Care – PPO | Admitting: Sports Medicine

## 2012-08-30 ENCOUNTER — Encounter: Payer: Self-pay | Admitting: Sports Medicine

## 2012-08-30 VITALS — BP 108/72 | HR 73 | Wt 219.0 lb

## 2012-08-30 DIAGNOSIS — M545 Low back pain, unspecified: Secondary | ICD-10-CM

## 2012-08-30 DIAGNOSIS — M25561 Pain in right knee: Secondary | ICD-10-CM

## 2012-08-30 DIAGNOSIS — M25569 Pain in unspecified knee: Secondary | ICD-10-CM

## 2012-08-30 NOTE — Progress Notes (Signed)
  Subjective:    CC: Follow up  HPI: Knee pain: To recap, this started after an injury decades ago, we treated him conservatively, and then obtained an MRI. The MRI results are dictated below. He still has a little pain that he localizes over the front of his knee, worse when going up and down stairs. He denies any mechanical symptoms, or any feelings of instability. He declined any interventional treatment today.  Low back pain: Worse when standing up straight, no radiation, overall better with physical therapy and conservative measures.  Past medical history, Surgical history, Family history not pertinant except as noted below, Social history, Allergies, and medications have been entered into the medical record, reviewed, and no changes needed.   Review of Systems: No fevers, chills, night sweats, weight loss, chest pain, or shortness of breath.   Objective:    General: Well Developed, well nourished, and in no acute distress.  Neuro: Alert and oriented x3, extra-ocular muscles intact, sensation grossly intact.  HEENT: Normocephalic, atraumatic, pupils equal round reactive to light, neck supple, no masses, no lymphadenopathy, thyroid nonpalpable.  Skin: Warm and dry, no rashes. Cardiac: Regular rate and rhythm, no murmurs rubs or gallops, no lower extremity edema.  Respiratory: Clear to auscultation bilaterally. Not using accessory muscles, speaking in full sentences. Right Knee: Normal to inspection with no erythema or effusion or obvious bony abnormalities. Palpation normal with no warmth, joint line tenderness, patellar tenderness, or condyle tenderness. ROM full in flexion and extension and lower leg rotation. Ligaments with solid consistent endpoints including ACL, PCL, LCL, MCL. Negative Mcmurray's, Apley's, and Thessalonian tests. Non painful patellar compression. Patellar glide without crepitus. Patellar and quadriceps tendons unremarkable. Hamstring and quadriceps strength is  normal.   MRI shows partial anterior cruciate ligament tear as well as arthritis.  Impression and Recommendations:

## 2012-08-30 NOTE — Assessment & Plan Note (Signed)
Symptoms do likely represent lumbar spinal stenosis. He is doing a little better with physical therapy, and has several sessions left, I propose that we revisit this in about 6 weeks before getting any more aggressive.

## 2012-08-30 NOTE — Assessment & Plan Note (Signed)
MRI shows arthritis and partial anterior cruciate ligament tear. His knee is stable, and with a partial tear I would not consider reconstruction. Certainly we could try an injection, for now he prefers to rehabilitation the knee which I think is entirely appropriate. I like to see him back in about 6 weeks to see how things are going.

## 2012-10-10 ENCOUNTER — Encounter: Payer: Self-pay | Admitting: Sports Medicine

## 2012-10-10 ENCOUNTER — Ambulatory Visit (INDEPENDENT_AMBULATORY_CARE_PROVIDER_SITE_OTHER): Payer: BC Managed Care – PPO | Admitting: Sports Medicine

## 2012-10-10 VITALS — BP 113/72 | HR 86 | Wt 220.0 lb

## 2012-10-10 DIAGNOSIS — M25569 Pain in unspecified knee: Secondary | ICD-10-CM

## 2012-10-10 DIAGNOSIS — M25561 Pain in right knee: Secondary | ICD-10-CM

## 2012-10-10 NOTE — Progress Notes (Signed)
  Subjective:    CC: Followup  HPI: Right knee pain: This is a very pleasant 47 year old male with known knee osteoarthritis. He has been through physical therapy, NSAIDs, pain relief has been insufficient. Pain is localized under the kneecap and the joint lines in the right knee, without swelling or mechanical symptoms. It is worsening, does not radiate, moderate.  Past medical history, Surgical history, Family history not pertinant except as noted below, Social history, Allergies, and medications have been entered into the medical record, reviewed, and no changes needed.   Review of Systems: No fevers, chills, night sweats, weight loss, chest pain, or shortness of breath.   Objective:    General: Well Developed, well nourished, and in no acute distress.  Neuro: Alert and oriented x3, extra-ocular muscles intact, sensation grossly intact.  HEENT: Normocephalic, atraumatic, pupils equal round reactive to light, neck supple, no masses, no lymphadenopathy, thyroid nonpalpable.  Skin: Warm and dry, no rashes. Cardiac: Regular rate and rhythm, no murmurs rubs or gallops, no lower extremity edema.  Respiratory: Clear to auscultation bilaterally. Not using accessory muscles, speaking in full sentences. Right Knee: Normal to inspection with no erythema or effusion or obvious bony abnormalities. Tender to palpation at the joint lines. ROM full in flexion and extension and lower leg rotation. Ligaments with solid consistent endpoints including ACL, PCL, LCL, MCL. Negative Mcmurray's, Apley's, and Thessalonian tests. Non painful patellar compression. Patellar glide without crepitus. Patellar and quadriceps tendons unremarkable. Hamstring and quadriceps strength is normal.   Procedure: Real-time Ultrasound Guided Injection of right knee Device: GE Logiq E  Verbal informed consent obtained.  Time-out conducted.  Noted no overlying erythema, induration, or other signs of local infection.  Skin  prepped in a sterile fashion.  Local anesthesia: Topical Ethyl chloride.  With sterile technique and under real time ultrasound guidance:  2 cc Kenalog 40, 4 cc lidocaine injected easily into the suprapatellar recess. Completed without difficulty  Pain immediately resolved suggesting accurate placement of the medication.  Advised to call if fevers/chills, erythema, induration, drainage, or persistent bleeding.  Images permanently stored and available for review in the ultrasound unit.  Impression: Technically successful ultrasound guided injection.  Impression and Recommendations:

## 2012-10-10 NOTE — Assessment & Plan Note (Signed)
Right knee osteoarthritis, injected as above. Return in one month.

## 2012-11-06 ENCOUNTER — Encounter: Payer: Self-pay | Admitting: Family Medicine

## 2012-11-06 ENCOUNTER — Telehealth: Payer: Self-pay | Admitting: *Deleted

## 2012-11-06 NOTE — Telephone Encounter (Signed)
Pt called and lvm informing that letter is up front ready for p/u.Loralee Pacas [employer]

## 2012-11-07 ENCOUNTER — Ambulatory Visit: Payer: BC Managed Care – PPO | Admitting: Sports Medicine

## 2012-11-08 ENCOUNTER — Encounter: Payer: Self-pay | Admitting: Sports Medicine

## 2012-11-08 ENCOUNTER — Ambulatory Visit (INDEPENDENT_AMBULATORY_CARE_PROVIDER_SITE_OTHER): Payer: BC Managed Care – PPO | Admitting: Sports Medicine

## 2012-11-08 VITALS — BP 116/66 | HR 64 | Wt 220.0 lb

## 2012-11-08 DIAGNOSIS — M75101 Unspecified rotator cuff tear or rupture of right shoulder, not specified as traumatic: Secondary | ICD-10-CM | POA: Insufficient documentation

## 2012-11-08 DIAGNOSIS — M67919 Unspecified disorder of synovium and tendon, unspecified shoulder: Secondary | ICD-10-CM

## 2012-11-08 DIAGNOSIS — M25561 Pain in right knee: Secondary | ICD-10-CM

## 2012-11-08 MED ORDER — MELOXICAM 15 MG PO TABS: ORAL_TABLET | ORAL | Status: DC

## 2012-11-08 NOTE — Progress Notes (Signed)
  Subjective:    CC: Follow up  HPI: Knee osteoarthritis: MRI did show chronic anterior cruciate ligament tear as well as osteoarthritis, pain is essentially resolved after injection.  Right shoulder pain: Present for years, worse over the deltoid, worse with overhead activities, occasional popping. Moderate, persistent, radiation.  Past medical history, Surgical history, Family history not pertinant except as noted below, Social history, Allergies, and medications have been entered into the medical record, reviewed, and no changes needed.   Review of Systems: No fevers, chills, night sweats, weight loss, chest pain, or shortness of breath.   Objective:    General: Well Developed, well nourished, and in no acute distress.  Neuro: Alert and oriented x3, extra-ocular muscles intact, sensation grossly intact.  HEENT: Normocephalic, atraumatic, pupils equal round reactive to light, neck supple, no masses, no lymphadenopathy, thyroid nonpalpable.  Skin: Warm and dry, no rashes. Cardiac: Regular rate and rhythm, no murmurs rubs or gallops, no lower extremity edema.  Respiratory: Clear to auscultation bilaterally. Not using accessory muscles, speaking in full sentences. Right Shoulder: Inspection reveals no abnormalities, atrophy or asymmetry. Palpation is normal with no tenderness over AC joint or bicipital groove. ROM is full in all planes. Rotator cuff strength normal throughout. Positive Neer's, Hawkins, and empty can sign. Speeds and Yergason's tests normal. No labral pathology noted with negative Obrien's, negative clunk and good stability. Normal scapular function observed. No painful arc and no drop arm sign. No apprehension sign Right Knee: Normal to inspection with no erythema or effusion or obvious bony abnormalities. Palpation normal with no warmth, joint line tenderness, patellar tenderness, or condyle tenderness. ROM full in flexion and extension and lower leg  rotation. Ligaments with solid consistent endpoints including ACL, PCL, LCL, MCL. Negative Mcmurray's, Apley's, and Thessalonian tests. Non painful patellar compression. Patellar glide without crepitus. Patellar and quadriceps tendons unremarkable. Hamstring and quadriceps strength is normal.  Impression and Recommendations:

## 2012-11-08 NOTE — Assessment & Plan Note (Signed)
Pain has essentially resolved after injection. He does have a chronic anterior cruciate ligament tear as well as osteoarthritis. He can see me back on an as needed basis for this, and if at least 3 months have past since the last injection we can consider another steroid injection, otherwise he would become a candidate for Visco supplementation.

## 2012-11-08 NOTE — Assessment & Plan Note (Signed)
Mobic, on rehabilitation. X-rays. Return in 4 weeks, consider injection if no better.

## 2012-12-06 ENCOUNTER — Ambulatory Visit (INDEPENDENT_AMBULATORY_CARE_PROVIDER_SITE_OTHER): Payer: BC Managed Care – PPO | Admitting: Sports Medicine

## 2012-12-06 ENCOUNTER — Encounter: Payer: Self-pay | Admitting: Sports Medicine

## 2012-12-06 VITALS — BP 115/71 | HR 59 | Wt 221.0 lb

## 2012-12-06 DIAGNOSIS — M75101 Unspecified rotator cuff tear or rupture of right shoulder, not specified as traumatic: Secondary | ICD-10-CM

## 2012-12-06 DIAGNOSIS — M67919 Unspecified disorder of synovium and tendon, unspecified shoulder: Secondary | ICD-10-CM

## 2012-12-06 MED ORDER — DICLOFENAC SODIUM 75 MG PO TBEC: 75.0000 mg | DELAYED_RELEASE_TABLET | Freq: Two times a day (BID) | ORAL | Status: DC

## 2012-12-06 NOTE — Progress Notes (Signed)
  Subjective:    CC: Followup  HPI: Right shoulder pain: Clinically rotator cuff dysfunction, has done very well with conservative measures and pain is much improved, he does have some bad days. Is not waking him up from sleep, he does not desire injection therapy. He has not yet taken any NSAIDs.  Past medical history, Surgical history, Family history not pertinant except as noted below, Social history, Allergies, and medications have been entered into the medical record, reviewed, and no changes needed.   Review of Systems: No fevers, chills, night sweats, weight loss, chest pain, or shortness of breath.   Objective:    General: Well Developed, well nourished, and in no acute distress.  Neuro: Alert and oriented x3, extra-ocular muscles intact, sensation grossly intact.  HEENT: Normocephalic, atraumatic, pupils equal round reactive to light, neck supple, no masses, no lymphadenopathy, thyroid nonpalpable.  Skin: Warm and dry, no rashes. Cardiac: Regular rate and rhythm, no murmurs rubs or gallops, no lower extremity edema.  Respiratory: Clear to auscultation bilaterally. Not using accessory muscles, speaking in full sentences. Right Shoulder: Inspection reveals no abnormalities, atrophy or asymmetry. Palpation is normal with no tenderness over AC joint or bicipital groove. ROM is full in all planes. Rotator cuff strength normal throughout. No signs of impingement with negative Neer and Hawkin's tests, empty can sign. Speeds and Yergason's tests normal. No labral pathology noted with negative Obrien's, negative clunk and good stability. Normal scapular function observed. No painful arc and no drop arm sign. No apprehension sign  Impression and Recommendations:

## 2012-12-06 NOTE — Assessment & Plan Note (Signed)
Improved significantly since last visit. He does feel as though he has plateaued. He is not taking an NSAID, I am going to call in diclofenac per patient request, he will continue on rehabilitation exercises, and return in about a month. If he continues to have occasional pain, I will inject the subacromial bursa.

## 2013-01-03 ENCOUNTER — Ambulatory Visit: Payer: BC Managed Care – PPO | Admitting: Sports Medicine

## 2013-01-06 ENCOUNTER — Ambulatory Visit (INDEPENDENT_AMBULATORY_CARE_PROVIDER_SITE_OTHER): Payer: Managed Care, Other (non HMO) | Admitting: Sports Medicine

## 2013-01-06 ENCOUNTER — Encounter: Payer: Self-pay | Admitting: Sports Medicine

## 2013-01-06 VITALS — BP 109/66 | HR 72 | Wt 223.0 lb

## 2013-01-06 DIAGNOSIS — M719 Bursopathy, unspecified: Secondary | ICD-10-CM

## 2013-01-06 DIAGNOSIS — M75101 Unspecified rotator cuff tear or rupture of right shoulder, not specified as traumatic: Secondary | ICD-10-CM

## 2013-01-06 DIAGNOSIS — M67919 Unspecified disorder of synovium and tendon, unspecified shoulder: Secondary | ICD-10-CM

## 2013-01-06 DIAGNOSIS — M25569 Pain in unspecified knee: Secondary | ICD-10-CM

## 2013-01-06 DIAGNOSIS — M25561 Pain in right knee: Secondary | ICD-10-CM

## 2013-01-06 NOTE — Assessment & Plan Note (Signed)
Did extremely well after injection 2 months ago. Continues to do well. Return as needed for this, if less than 3 months after her last injection we would proceed with Visco supplementation.

## 2013-01-06 NOTE — Assessment & Plan Note (Signed)
Continues to improve, Joel Perry does desire to pursue further noninvasive rehabilitation measures which I think is appropriate. He can come back to see me on an as needed basis for this.

## 2013-01-06 NOTE — Progress Notes (Signed)
  Subjective:    CC: Followup  HPI: Right-sided rotator cuff dysfunction: Continues to improve with conservative measures, he still has some bad days but desires to further his workouts, and avoid intervention right now.  Right knee pain: History of anterior cruciate ligament reconstruction and subsequent osteoarthritis, much better after injection.  Past medical history, Surgical history, Family history not pertinant except as noted below, Social history, Allergies, and medications have been entered into the medical record, reviewed, and no changes needed.   Review of Systems: No fevers, chills, night sweats, weight loss, chest pain, or shortness of breath.   Objective:    General: Well Developed, well nourished, and in no acute distress.  Neuro: Alert and oriented x3, extra-ocular muscles intact, sensation grossly intact.  HEENT: Normocephalic, atraumatic, pupils equal round reactive to light, neck supple, no masses, no lymphadenopathy, thyroid nonpalpable.  Skin: Warm and dry, no rashes. Cardiac: Regular rate and rhythm, no murmurs rubs or gallops, no lower extremity edema.  Respiratory: Clear to auscultation bilaterally. Not using accessory muscles, speaking in full sentences. Right Shoulder: Inspection reveals no abnormalities, atrophy or asymmetry. Palpation is normal with no tenderness over AC joint or bicipital groove. ROM is full in all planes. Rotator cuff strength normal throughout. No signs of impingement with negative Neer and Hawkin's tests, empty can sign. Speeds and Yergason's tests normal. No labral pathology noted with negative Obrien's, negative clunk and good stability. Normal scapular function observed. No painful arc and no drop arm sign. No apprehension sign Right Knee: Normal to inspection with no erythema or effusion or obvious bony abnormalities. Palpation normal with no warmth, joint line tenderness, patellar tenderness, or condyle tenderness. ROM full in  flexion and extension and lower leg rotation. Ligaments with solid consistent endpoints including ACL, PCL, LCL, MCL. Negative Mcmurray's, Apley's, and Thessalonian tests. Non painful patellar compression. Patellar glide without crepitus. Patellar and quadriceps tendons unremarkable. Hamstring and quadriceps strength is normal.   Impression and Recommendations:

## 2013-01-06 NOTE — Patient Instructions (Signed)
Look into Viscosupplementation/Supartz injections for knee arthritis.

## 2013-02-14 ENCOUNTER — Encounter: Payer: Self-pay | Admitting: Sports Medicine

## 2013-03-14 ENCOUNTER — Ambulatory Visit (INDEPENDENT_AMBULATORY_CARE_PROVIDER_SITE_OTHER): Payer: Managed Care, Other (non HMO) | Admitting: Family Medicine

## 2013-03-14 ENCOUNTER — Encounter: Payer: Self-pay | Admitting: Family Medicine

## 2013-03-14 VITALS — BP 106/67 | HR 69 | Ht 68.0 in | Wt 224.0 lb

## 2013-03-14 DIAGNOSIS — R21 Rash and other nonspecific skin eruption: Secondary | ICD-10-CM

## 2013-03-14 NOTE — Progress Notes (Signed)
   Subjective:    Patient ID: OTHA MONICAL, male    DOB: 04-24-1965, 48 y.o.   MRN: [id number]  HPI Patient noticed a couple spots on his left upper arm on Sunday, approximately 5 or 6 days ago. He says occasionally is itchy but otherwise not bothersome. It's not painful. He had his wife check his skin and she did not notice any other lesions on him. He does have one on the left posterior shoulder. He denies any new soaps lotions or perfumes. He does not wear anything over that particular arm during work out at [employer] etc.   Review of Systems     Objective:   Physical Exam  Constitutional: He appears well-developed and well-nourished.  HENT:  Head: Normocephalic and atraumatic.  Skin: Skin is warm and dry. Rash noted.  Dry macular hyperpigmented lesions on the left upper arm. He has about 4 or 5. They are between half a centimeter and 1 cm in size.  Psychiatric: He has a normal mood and affect. His behavior is normal.          Assessment & Plan:  Rash on left upper arm-eczema versus tinea versicolor. Skin scraping performed. We'll call with the results on Monday. Explained to him that this can be treated topically pending on which diagnosis is correct. We will call him with results as it is possible.

## 2013-03-14 NOTE — Addendum Note (Signed)
Addended by: Teddy Spike on: 03/14/2013 01:30 PM   Modules accepted: Orders

## 2013-03-16 LAB — KOH PREP: RESULT - KOH: NONE SEEN

## 2013-03-17 ENCOUNTER — Other Ambulatory Visit: Payer: Self-pay | Admitting: Family Medicine

## 2013-03-17 MED ORDER — TRIAMCINOLONE ACETONIDE 0.5 % EX OINT: 1.0000 "application " | TOPICAL_OINTMENT | Freq: Every day | CUTANEOUS | Status: DC

## 2013-07-03 ENCOUNTER — Ambulatory Visit (INDEPENDENT_AMBULATORY_CARE_PROVIDER_SITE_OTHER): Payer: Managed Care, Other (non HMO) | Admitting: Sports Medicine

## 2013-07-03 ENCOUNTER — Encounter: Payer: Self-pay | Admitting: Sports Medicine

## 2013-07-03 VITALS — BP 114/73 | HR 92 | Ht 68.0 in | Wt 220.0 lb

## 2013-07-03 DIAGNOSIS — I8390 Asymptomatic varicose veins of unspecified lower extremity: Secondary | ICD-10-CM

## 2013-07-03 DIAGNOSIS — M1710 Unilateral primary osteoarthritis, unspecified knee: Secondary | ICD-10-CM

## 2013-07-03 DIAGNOSIS — M1711 Unilateral primary osteoarthritis, right knee: Secondary | ICD-10-CM

## 2013-07-03 MED ORDER — AMBULATORY NON FORMULARY MEDICATION: Status: DC

## 2013-07-03 NOTE — Assessment & Plan Note (Signed)
Still doing okay after injection last year, and diclofenac twice a day. Next up would be viscous supplementation. He will think about this and let us know.

## 2013-07-03 NOTE — Assessment & Plan Note (Signed)
Mostly right-sided. Recommended lower extremity compression stockings. Return as needed, we can certainly consider thermal ablation in the future.

## 2013-07-03 NOTE — Patient Instructions (Signed)
Varicose Veins Varicose veins are veins that have become enlarged and twisted. CAUSES This condition is the result of valves in the veins not working properly. Valves in the veins help return blood from the leg to the heart. If these valves are damaged, blood flows backwards and backs up into the veins in the leg near the skin. This causes the veins to become larger. People who are on their feet a lot, who are pregnant, or who are overweight are more likely to develop varicose veins. SYMPTOMS   Bulging, twisted-appearing, bluish veins, most commonly found on the legs.  Leg pain or a feeling of heaviness. These symptoms may be worse at the end of the day.  Leg swelling.  Skin color changes. DIAGNOSIS  Varicose veins can usually be diagnosed with an exam of your legs by your caregiver. He or she may recommend an ultrasound of your leg veins. TREATMENT  Most varicose veins can be treated at home.However, other treatments are available for people who have persistent symptoms or who want to treat the cosmetic appearance of the varicose veins. These include:  Laser treatment of very small varicose veins.  Medicine that is shot (injected) into the vein. This medicine hardens the walls of the vein and closes off the vein. This treatment is called sclerotherapy. Afterwards, you may need to wear clothing or bandages that apply pressure.  Surgery. HOME CARE INSTRUCTIONS   Do not stand or sit in one position for long periods of time. Do not sit with your legs crossed. Rest with your legs raised during the day.  Wear elastic stockings or support hose. Do not wear other tight, encircling garments around the legs, pelvis, or waist.  Walk as much as possible to increase blood flow.  Raise the foot of your bed at night with 2-inch blocks.  If you get a cut in the skin over the vein and the vein bleeds, lie down with your leg raised and press on it with a clean cloth until the bleeding stops.  Then place a bandage (dressing) on the cut. See your caregiver if it continues to bleed or needs stitches. SEEK MEDICAL CARE IF:   The skin around your ankle starts to break down.  You have pain, redness, tenderness, or hard swelling developing in your leg over a vein.  You are uncomfortable due to leg pain. Document Released: 09/28/2004 Document Revised: 03/13/2011 Document Reviewed: 02/14/2010 [facility] Patient Information 2015 [place], [place]. This information is not intended to replace advice given to you by your health care provider. Make sure you discuss any questions you have with your health care provider.  Varicose Vein Surgery Varicose veins are veins that have become enlarged and twisted. Small varicose veins are sometimes called "spider veins." Valves in the veins help move blood from the leg to the heart. If these valves are damaged, blood flows backwards. The blood backs up into the veins in the leg near the skin surface. The veins expand and get larger because of increased pressure against the inside walls of the veins. People who are on their feet for long periods are at higher risk for this problem. It is also commonly seen during pregnancy or in those who are overweight. Varicose vein surgery is done to remove enlarged veins. This helps reduce pain, aching, and the risk of bleeding and blood clots that can form in these veins. Surgery can also improve the cosmetic appearance of the affected area. There are several surgical methods that can  be used. Your caregiver will discuss the method that is best for you based on your specific needs. Treatment usually does not mean a hospital stay or a long, uncomfortable recovery. Less invasive techniques most often allow varicose veins to be dealt with on an outpatient basis. LET YOUR CAREGIVER KNOW ABOUT:   Allergies.  Medications taken including herbs, eye drops, prescription medications, over the counter medications, and creams.  Use of  steroids (by mouth or creams).  Use of aspirin or blood-thinning medicines.  History of bleeding or blood problems.  Previous problems with anesthetics or Novocaine.  Possibility of pregnancy, if this applies.  History of blood clots (thrombophlebitis).  Previous surgery.  Other health problems. RISKS AND COMPLICATIONS  Blood clots in deep veins (thrombophlebitis).  Infection.  Ulcer of the skin (breakdown of skin).  Recurrent varicose veins.  If receiving Sclerotherapy (see below), the following are uncommon but possible:  Temporary stinging or painful cramps where the injection was made.  Temporary red, raised patches of skin where the injection was made.  Temporary small skin sores where the injection was made.  Temporary bruises where the injection was made.  Spots around the treated vein that usually disappear.  Brown lines around the treated vein that usually disappear.  Groups of fine, red blood vessels around the treated vein that usually disappear.  The treated vein can also become inflamed or develop lumps of clotted blood. This is not dangerous. BEFORE THE PROCEDURE  If you are using any "blood thinner" medicines or medicines for arthritis, they may need to be stopped temporarily before the procedure. Make sure you discuss this with your caregiver before surgery. PROCEDURE Several types of surgery are available for safe and effective treatment of varicose veins:   Sclerotherapy. Your caregiver injects small and medium sized varicose veins with a solution that scars and closes those veins. In a few weeks, treated varicose veins should fade. Some veins may need to be injected more than once. Sclerotherapy is effective if done correctly. Sclerotherapy does not require anesthesia and can be done in your doctor's office.  Laser surgeries. Doctors use lasers on smaller varicose veins. Laser surgery works by sending strong bursts of light onto the vein, which  makes the vein slowly fade and disappear. No incisions or needles are used. The whole procedure usually takes less than 45 minutes.  Catheter-assisted procedures. This procedure is usually done for larger varicose veins. In one of these treatments, your caregiver inserts a thin tube (catheter) into an enlarged vein and heats the tip of the catheter. As the catheter is pulled out, the heat destroys the vein by causing it to collapse and seal shut.  Vein stripping. This involves removing a long vein through small incisions. This is an outpatient procedure for most, but not all people. Removing the vein does not affect circulation in your leg because veins deeper in the leg take care of the larger volumes of blood.  Ambulatory phlebectomy. Your doctor removes smaller varicose veins through a series of tiny skin punctures. Local anesthesia is used in this outpatient procedure. Scarring is generally minimal.  Endoscopic vein surgery. You might need this operation only in a severe case involving leg ulcers. Your caregiver uses a thin video camera and inserts it into your leg to close varicose veins. The doctor then removes the veins through small incisions. AFTER THE PROCEDURE   You may be asked to wear a compression stocking for 3 to 5 days. This will insure that the  treated vein(s) stays closed.  If you were taking any "blood thinner" medicines or medicines for arthritis, talk to your caregiver about when these can be restarted. HOME CARE INSTRUCTIONS   You may need to elevate the treated leg for part of the day for the first 2-3 days after treatment  You may need to continue using compression stockings to lower the chance of developing recurrent varicose veins.  Restart regular prescription and non-prescription medicines as per your caregiver's instructions. SEEK MEDICAL CARE IF:   An oral temperature above 102 F (38.9 C) develops, not controlled by medication.  You notice increasing pain not  adequately controlled with medicine prescribed.  You notice pus or oozing of fluid from any incision site 2 or more days after the surgery. SEEK IMMEDIATE MEDICAL CARE IF:   There is increased pain or swelling in the calf of your leg.  You notice red-streaking starting at an incision site and extending above or below that site.  An oral temperature above 102 F (38.9 C) develops, not controlled by medication.  You have sudden onset of chest pain, shortness of breath, difficulty breathing, or begin coughing up blood.  You develop unexplained abdominal pain.  You have increased bruising or develop sudden swelling in a joint. Document Released: 01/08/2007 Document Revised: 03/13/2011 Document Reviewed: 01/08/2007 [facility] Patient Information 2015 [place], [place]. This information is not intended to replace advice given to you by your health care provider. Make sure you discuss any questions you have with your health care provider.

## 2013-07-03 NOTE — Progress Notes (Signed)
  Subjective:    CC: Followup  HPI: Right knee osteoarthritis : overall doing well, his last injection was over half a year ago. He is not yet ready to pursue viscous supplementation. Pain is mild. Persistent.  Varicose veins: Present on the right side of his lower leg, minimally tender to palpation, no radiation, moderate, persistent.  Past medical history, Surgical history, Family history not pertinant except as noted below, Social history, Allergies, and medications have been entered into the medical record, reviewed, and no changes needed.   Review of Systems: No fevers, chills, night sweats, weight loss, chest pain, or shortness of breath.   Objective:    General: Well Developed, well nourished, and in no acute distress.  Neuro: Alert and oriented x3, extra-ocular muscles intact, sensation grossly intact.  HEENT: Normocephalic, atraumatic, pupils equal round reactive to light, neck supple, no masses, no lymphadenopathy, thyroid nonpalpable.  Skin: Warm and dry, no rashes. Varicose veins on the right lower extremity, negative Homans sign, no other sign of DVT. Cardiac: Regular rate and rhythm, no murmurs rubs or gallops, no lower extremity edema.  Respiratory: Clear to auscultation bilaterally. Not using accessory muscles, speaking in full sentences. Right Knee: Normal to inspection with no erythema or effusion or obvious bony abnormalities. Palpation normal with no warmth, joint line tenderness, patellar tenderness, or condyle tenderness. ROM full in flexion and extension and lower leg rotation. Ligaments with solid consistent endpoints including ACL, PCL, LCL, MCL. Negative Mcmurray's, Apley's, and Thessalonian tests. Non painful patellar compression. Patellar glide without crepitus. Patellar and quadriceps tendons unremarkable. Hamstring and quadriceps strength is normal.   Impression and Recommendations:

## 2013-07-21 ENCOUNTER — Telehealth: Payer: Self-pay

## 2013-07-21 DIAGNOSIS — M75101 Unspecified rotator cuff tear or rupture of right shoulder, not specified as traumatic: Secondary | ICD-10-CM

## 2013-07-21 MED ORDER — DICLOFENAC SODIUM 75 MG PO TBEC: 75.0000 mg | DELAYED_RELEASE_TABLET | Freq: Two times a day (BID) | ORAL | Status: DC

## 2013-07-21 NOTE — Telephone Encounter (Signed)
Patient request refill for Diclofenac. Rhonda Cunningham,CMA

## 2013-07-21 NOTE — Telephone Encounter (Signed)
Done

## 2014-03-20 ENCOUNTER — Encounter: Payer: Self-pay | Admitting: Sports Medicine

## 2014-03-20 ENCOUNTER — Ambulatory Visit (INDEPENDENT_AMBULATORY_CARE_PROVIDER_SITE_OTHER): Payer: Managed Care, Other (non HMO) | Admitting: Sports Medicine

## 2014-03-20 VITALS — BP 112/78 | HR 65 | Ht 68.5 in | Wt 220.0 lb

## 2014-03-20 DIAGNOSIS — L989 Disorder of the skin and subcutaneous tissue, unspecified: Secondary | ICD-10-CM | POA: Diagnosis not present

## 2014-03-20 NOTE — Assessment & Plan Note (Signed)
Appears to be an actinic keratosis versus cutaneous horn, 4 mm punch biopsy as above and Single suture placed. Return early next Thursday for suture removal and a nurse visit, he does have a haircut later on Thursday.

## 2014-03-20 NOTE — Progress Notes (Signed)
  Subjective:    CC:  Lesion on scalp  HPI: This pleasant african [place] male presents with complain of a small bump he noticed on his scalp 1 month ago patient noticed a small bump on his scalp, it is not painful or itchy but he cannot help but pick at it. He is sometimes able to peel a bit of the lesion but it grows back. He has not noticed any weep from the lesion. He denies any international travel within the past year. He does keep his hair cropped short with hair cuts every week and does not wear hats. He denies any fever, chills, similar lesions in the past.  Past medical history, Surgical history, Family history not pertinant except as noted below, Social history, Allergies, and medications have been entered into the medical record, reviewed, and no changes needed.   Review of Systems: No fevers, chills, night sweats, weight loss, chest pain, or shortness of breath.   Objective:    General: Well Developed, well nourished, and in no acute distress.  Neuro: Alert and oriented x3, extra-ocular muscles intact, sensation grossly intact.  HEENT: Normocephalic, atraumatic, pupils equal round reactive to light, neck supple, no masses, no lymphadenopathy, thyroid nonpalpable.  Skin: Warm and dry. A 51mm x 53mm flesh colored papule with scale is present at the crown of his scalp, no erythema or exudate. Cardiac: Regular rate and rhythm, no murmurs rubs or gallops, no lower extremity edema.  Respiratory: Clear to auscultation bilaterally. Not using accessory muscles, speaking in full sentences.  Procedure:  Excision of 4 mm scalp lesion Risks, benefits, and alternatives explained and consent obtained. Time out conducted. Surface prepped with alcohol. 2cc lidocaine with epinephine infiltrated in a field block. Adequate anesthesia ensured. Area prepped and draped in a sterile fashion. Excision performed with: 4 mm punch biopsy taken, specimen sent to pathology, and a single 4-0 Prolene simple  interrupted suture was placed. Hemostasis achieved. Pt stable.  Impression and Recommendations:    # Scalp lesion - Clinical appearance of lesion in sun exposed area most likely suggests actinic keratosis or cutaneous horn. Less likely diagnoses include folliculitis, papilloma, or basal cell carcinoma. - Punch biopsy under local anesthesia performed today (see procedure note) - Patient to return in 6 days for suture removal, patient given return precautions - Recommended wearing a hat for sun protection    Follow up in 6 days or sooner as needed

## 2014-03-25 ENCOUNTER — Encounter: Payer: Self-pay | Admitting: Sports Medicine

## 2014-03-25 ENCOUNTER — Ambulatory Visit (INDEPENDENT_AMBULATORY_CARE_PROVIDER_SITE_OTHER): Payer: Managed Care, Other (non HMO) | Admitting: Sports Medicine

## 2014-03-25 VITALS — BP 110/74 | HR 73 | Wt 226.0 lb

## 2014-03-25 DIAGNOSIS — E785 Hyperlipidemia, unspecified: Secondary | ICD-10-CM | POA: Diagnosis not present

## 2014-03-25 DIAGNOSIS — E291 Testicular hypofunction: Secondary | ICD-10-CM

## 2014-03-25 DIAGNOSIS — E119 Type 2 diabetes mellitus without complications: Secondary | ICD-10-CM | POA: Diagnosis not present

## 2014-03-25 DIAGNOSIS — L989 Disorder of the skin and subcutaneous tissue, unspecified: Secondary | ICD-10-CM | POA: Diagnosis not present

## 2014-03-25 DIAGNOSIS — Z0000 Encounter for general adult medical examination without abnormal findings: Secondary | ICD-10-CM

## 2014-03-25 NOTE — Progress Notes (Signed)
  Subjective:    CC: Follow-up  HPI: Scalp lesion: Pathology showed a simple verruca, sutures will be removed today, patient is doing well.  Preventative measures: Has not seen Dr. Madilyn Fireman with blood work in a long time, I'm happy to order the blood work and he can follow up with her.  Past medical history, Surgical history, Family history not pertinant except as noted below, Social history, Allergies, and medications have been entered into the medical record, reviewed, and no changes needed.   Review of Systems: No fevers, chills, night sweats, weight loss, chest pain, or shortness of breath.   Objective:    General: Well Developed, well nourished, and in no acute distress.  Neuro: Alert and oriented x3, extra-ocular muscles intact, sensation grossly intact.  HEENT: Normocephalic, atraumatic, pupils equal round reactive to light, neck supple, no masses, no lymphadenopathy, thyroid nonpalpable.  Skin: Warm and dry, no rashes. Cardiac: Regular rate and rhythm, no murmurs rubs or gallops, no lower extremity edema.  Respiratory: Clear to auscultation bilaterally. Not using accessory muscles, speaking in full sentences. Scalp: Incision is clean, dry, intact, a single Prolene simple interrupted suture was removed.  Impression and Recommendations:

## 2014-03-25 NOTE — Assessment & Plan Note (Signed)
Suture removed today, wound is healing well. Pathology showed a simple verruca. No further follow-up needed.

## 2014-03-25 NOTE — Assessment & Plan Note (Signed)
Routine blood work including lipids and metabolic panel considering history of transaminitis. He can follow this up with PCP.

## 2014-04-10 ENCOUNTER — Telehealth: Payer: Self-pay

## 2014-04-10 DIAGNOSIS — Z0000 Encounter for general adult medical examination without abnormal findings: Secondary | ICD-10-CM

## 2014-04-10 NOTE — Assessment & Plan Note (Signed)
Checking PSA per patient request.

## 2014-04-10 NOTE — Telephone Encounter (Signed)
Patient request an order to get his PSA checked. Please advise patient will be getting labs done on 04/14/14. Rhonda Cunningham,CMA

## 2014-04-10 NOTE — Telephone Encounter (Signed)
Orders placed.

## 2014-04-15 DIAGNOSIS — E291 Testicular hypofunction: Secondary | ICD-10-CM | POA: Insufficient documentation

## 2014-04-15 DIAGNOSIS — E785 Hyperlipidemia, unspecified: Secondary | ICD-10-CM | POA: Insufficient documentation

## 2014-04-15 DIAGNOSIS — E119 Type 2 diabetes mellitus without complications: Secondary | ICD-10-CM | POA: Insufficient documentation

## 2014-04-15 LAB — COMPREHENSIVE METABOLIC PANEL
ALT: 50 U/L (ref 0–53)
AST: 35 U/L (ref 0–37)
Alkaline Phosphatase: 72 U/L (ref 39–117)
Calcium: 9.5 mg/dL (ref 8.4–10.5)
Chloride: 103 mEq/L (ref 96–112)
Creat: 1.29 mg/dL (ref 0.50–1.35)
Potassium: 4.4 mEq/L (ref 3.5–5.3)

## 2014-04-15 LAB — CBC
HCT: 44.7 % (ref 39.0–52.0)
Hemoglobin: 14.4 g/dL (ref 13.0–17.0)
MCH: 25.9 pg — ABNORMAL LOW (ref 26.0–34.0)
MCHC: 32.2 g/dL (ref 30.0–36.0)
MCV: 80.3 fL (ref 78.0–100.0)
MPV: 10.8 fL (ref 8.6–12.4)
Platelets: 183 K/uL (ref 150–400)
RBC: 5.57 MIL/uL (ref 4.22–5.81)
RDW: 15 % (ref 11.5–15.5)
WBC: 5 K/uL (ref 4.0–10.5)

## 2014-04-15 LAB — LIPID PANEL
Cholesterol: 197 mg/dL (ref 0–200)
HDL: 53 mg/dL (ref >=40)
LDL Cholesterol: 123 mg/dL — ABNORMAL HIGH (ref 0–99)
Total CHOL/HDL Ratio: 3.7 Ratio
Triglycerides: 106 mg/dL (ref <150)
VLDL: 21 mg/dL (ref 0–40)

## 2014-04-15 LAB — COMPREHENSIVE METABOLIC PANEL WITH GFR
Albumin: 4.2 g/dL (ref 3.5–5.2)
BUN: 15 mg/dL (ref 6–23)
CO2: 28 meq/L (ref 19–32)
Glucose, Bld: 94 mg/dL (ref 70–99)
Sodium: 139 meq/L (ref 135–145)
Total Bilirubin: 0.6 mg/dL (ref 0.2–1.2)
Total Protein: 7 g/dL (ref 6.0–8.3)

## 2014-04-15 LAB — TSH: TSH: 1.978 u[IU]/mL (ref 0.350–4.500)

## 2014-04-15 LAB — PSA, TOTAL AND FREE
PSA, Free Pct: 17 % — ABNORMAL LOW (ref >25)
PSA, Free: 0.17 ng/mL
PSA: 1.01 ng/mL (ref <=4.00)

## 2014-04-15 LAB — VITAMIN D 25 HYDROXY (VIT D DEFICIENCY, FRACTURES): Vit D, 25-Hydroxy: 30 ng/mL (ref 30–100)

## 2014-04-15 LAB — TESTOSTERONE: Testosterone: 217 ng/dL — ABNORMAL LOW (ref 300–890)

## 2014-04-15 LAB — HEMOGLOBIN A1C
Hgb A1c MFr Bld: 6.5 % — ABNORMAL HIGH (ref <5.7)
Mean Plasma Glucose: 140 mg/dL — ABNORMAL HIGH (ref <117)

## 2014-04-28 ENCOUNTER — Ambulatory Visit: Payer: Managed Care, Other (non HMO) | Admitting: Sports Medicine

## 2014-05-04 ENCOUNTER — Ambulatory Visit (INDEPENDENT_AMBULATORY_CARE_PROVIDER_SITE_OTHER): Payer: Managed Care, Other (non HMO) | Admitting: Sports Medicine

## 2014-05-04 ENCOUNTER — Encounter: Payer: Self-pay | Admitting: Sports Medicine

## 2014-05-04 VITALS — BP 109/74 | HR 76 | Ht 68.5 in | Wt 224.0 lb

## 2014-05-04 DIAGNOSIS — E785 Hyperlipidemia, unspecified: Secondary | ICD-10-CM

## 2014-05-04 DIAGNOSIS — E291 Testicular hypofunction: Secondary | ICD-10-CM | POA: Diagnosis not present

## 2014-05-04 DIAGNOSIS — E119 Type 2 diabetes mellitus without complications: Secondary | ICD-10-CM

## 2014-05-04 NOTE — Patient Instructions (Signed)

## 2014-05-04 NOTE — Progress Notes (Signed)
  Subjective:    CC: Follow-up  HPI: Diabetes mellitus type 2: A1c was 6.5, currently diet-controlled, would like to discuss.  Hyperlipidemia: LDL is now outside of the appropriate range considering new diagnosis of diabetes.  Male hypogonadism: Testosterone levels were mildly low, does endorse some low libido.  Past medical history, Surgical history, Family history not pertinant except as noted below, Social history, Allergies, and medications have been entered into the medical record, reviewed, and no changes needed.   Review of Systems: No fevers, chills, night sweats, weight loss, chest pain, or shortness of breath.   Objective:    General: Well Developed, well nourished, and in no acute distress.  Neuro: Alert and oriented x3, extra-ocular muscles intact, sensation grossly intact.  HEENT: Normocephalic, atraumatic, pupils equal round reactive to light, neck supple, no masses, no lymphadenopathy, thyroid nonpalpable.  Skin: Warm and dry, no rashes. Cardiac: Regular rate and rhythm, no murmurs rubs or gallops, no lower extremity edema.  Respiratory: Clear to auscultation bilaterally. Not using accessory muscles, speaking in full sentences.  Impression and Recommendations:

## 2014-05-04 NOTE — Assessment & Plan Note (Signed)
Currently diet controlled. He will work on a low carbohydrate diet week and recheck in 3 months.

## 2014-05-04 NOTE — Assessment & Plan Note (Signed)
LDL is high secondary to a diagnosis of diabetes. Again, work on weight loss and low cholesterol diet for 3 months and then we can recheck.

## 2014-05-04 NOTE — Assessment & Plan Note (Signed)
Low libido. Testosterone was 200. We can recheck later this week. He would likely supplement with a topical agent.

## 2014-05-07 ENCOUNTER — Telehealth: Payer: Self-pay

## 2014-05-07 DIAGNOSIS — E119 Type 2 diabetes mellitus without complications: Secondary | ICD-10-CM

## 2014-05-07 LAB — TESTOSTERONE, FREE, TOTAL, SHBG
Sex Hormone Binding: 23 nmol/L (ref 10–50)
Testosterone, Free: 83.8 pg/mL (ref 47.0–244.0)
Testosterone-% Free: 2.4 % (ref 1.6–2.9)
Testosterone: 346 ng/dL (ref 300–890)

## 2014-05-07 LAB — FOLLICLE STIMULATING HORMONE: FSH: 2.6 m[IU]/mL (ref 1.4–18.1)

## 2014-05-07 LAB — LUTEINIZING HORMONE: LH: 3.1 m[IU]/mL (ref 1.5–9.3)

## 2014-05-07 NOTE — Telephone Encounter (Signed)
Patient called requested a referral for a nutrinalist to help with his weight loss because it is hard for him to cut carbs. Rhonda Cunningham,CMA

## 2014-05-07 NOTE — Telephone Encounter (Signed)
Done

## 2014-08-04 ENCOUNTER — Ambulatory Visit (INDEPENDENT_AMBULATORY_CARE_PROVIDER_SITE_OTHER): Payer: Managed Care, Other (non HMO) | Admitting: Sports Medicine

## 2014-08-04 ENCOUNTER — Encounter: Payer: Self-pay | Admitting: Sports Medicine

## 2014-08-04 VITALS — BP 113/74 | HR 74 | Ht 69.0 in | Wt 211.0 lb

## 2014-08-04 DIAGNOSIS — E119 Type 2 diabetes mellitus without complications: Secondary | ICD-10-CM

## 2014-08-04 LAB — POCT GLYCOSYLATED HEMOGLOBIN (HGB A1C): Hemoglobin A1C: 6.1

## 2014-08-04 NOTE — Progress Notes (Signed)
  Subjective:    CC: Follow-up  HPI: Diabetes mellitus type 2: Has worked on dietary changes over the past 3 months, has lost a significant amount of weight. Recent eye doctor visit in February. No complaints.  He does have some swelling in his right leg, localized to a vein along the medial aspect, wonders what this is.  Past medical history, Surgical history, Family history not pertinant except as noted below, Social history, Allergies, and medications have been entered into the medical record, reviewed, and no changes needed.   Review of Systems: No fevers, chills, night sweats, weight loss, chest pain, or shortness of breath.   Objective:    General: Well Developed, well nourished, and in no acute distress.  Neuro: Alert and oriented x3, extra-ocular muscles intact, sensation grossly intact.  HEENT: Normocephalic, atraumatic, pupils equal round reactive to light, neck supple, no masses, no lymphadenopathy, thyroid nonpalpable.  Skin: Warm and dry, no rashes. Cardiac: Regular rate and rhythm, no murmurs rubs or gallops, no lower extremity edema.  Respiratory: Clear to auscultation bilaterally. Not using accessory muscles, speaking in full sentences. Right leg: Varicose great saphenous vein visible, nontender, no palpable clots, negative Homans sign. No leg swelling. Diabetic Foot Exam Both feet were examined, there are no signs of ulceration or abnormal callus. Nails are unremarkable. Dorsalis pedis and posterior tibial pulses are palpable. Sensation is intact to sharp and monofilament. Shoes are of appropriate fitment.  Hemoglobin A1c has dropped to 6.1.  Impression and Recommendations:    I spent 25 minutes with this patient, greater than 50% was face-to-face time counseling regarding the above diagnoses

## 2014-08-04 NOTE — Assessment & Plan Note (Signed)
Improved A1c down to 6.1. We still need to check urine microalbumin, foot exam done today. Rechecking lipids.

## 2014-08-04 NOTE — Patient Instructions (Signed)
[email or web address], Zippered Compression Stockings, medium circ, long length

## 2014-08-05 LAB — CBC
HCT: 44 % (ref 39.0–52.0)
Hemoglobin: 14.4 g/dL (ref 13.0–17.0)
MCH: 26 pg (ref 26.0–34.0)
MCHC: 32.7 g/dL (ref 30.0–36.0)
MCV: 79.4 fL (ref 78.0–100.0)
MPV: 10.6 fL (ref 8.6–12.4)
Platelets: 174 10*3/uL (ref 150–400)
RBC: 5.54 MIL/uL (ref 4.22–5.81)
RDW: 14.8 % (ref 11.5–15.5)
WBC: 4.3 10*3/uL (ref 4.0–10.5)

## 2014-08-05 LAB — COMPREHENSIVE METABOLIC PANEL
ALT: 35 U/L (ref 9–46)
AST: 27 U/L (ref 10–40)
Alkaline Phosphatase: 73 U/L (ref 40–115)
Chloride: 102 mmol/L (ref 98–110)
Creat: 1.34 mg/dL (ref 0.60–1.35)
Glucose, Bld: 86 mg/dL (ref 65–99)
Potassium: 4.4 mmol/L (ref 3.5–5.3)
Total Bilirubin: 0.5 mg/dL (ref 0.2–1.2)
Total Protein: 7.3 g/dL (ref 6.1–8.1)

## 2014-08-05 LAB — LIPID PANEL
Cholesterol: 176 mg/dL (ref 125–200)
HDL: 53 mg/dL (ref >=40)
LDL Cholesterol: 109 mg/dL (ref <130)
Total CHOL/HDL Ratio: 3.3 Ratio (ref <=5.0)
Triglycerides: 72 mg/dL (ref <150)
VLDL: 14 mg/dL (ref <30)

## 2014-08-05 LAB — COMPREHENSIVE METABOLIC PANEL WITH GFR
Albumin: 4 g/dL (ref 3.6–5.1)
BUN: 18 mg/dL (ref 7–25)
CO2: 28 mmol/L (ref 20–31)
Calcium: 9.8 mg/dL (ref 8.6–10.3)
Sodium: 140 mmol/L (ref 135–146)

## 2014-08-06 LAB — MICROALBUMIN / CREATININE URINE RATIO
Creatinine, Urine: 187.4 mg/dL
Microalb Creat Ratio: 1.1 mg/g (ref 0.0–30.0)
Microalb, Ur: 0.2 mg/dL (ref <2.0)

## 2014-10-07 ENCOUNTER — Other Ambulatory Visit: Payer: Self-pay | Admitting: Sports Medicine

## 2014-10-08 ENCOUNTER — Other Ambulatory Visit: Payer: Self-pay

## 2014-10-08 NOTE — Telephone Encounter (Signed)
Patient left a vm following up on Diclofenac refill. Rhonda Cunningham,CMA

## 2014-11-04 ENCOUNTER — Encounter: Payer: Self-pay | Admitting: Sports Medicine

## 2014-11-04 ENCOUNTER — Ambulatory Visit (INDEPENDENT_AMBULATORY_CARE_PROVIDER_SITE_OTHER): Payer: Managed Care, Other (non HMO) | Admitting: Sports Medicine

## 2014-11-04 VITALS — BP 126/68 | HR 62 | Wt 215.0 lb

## 2014-11-04 DIAGNOSIS — E785 Hyperlipidemia, unspecified: Secondary | ICD-10-CM

## 2014-11-04 DIAGNOSIS — I10 Essential (primary) hypertension: Secondary | ICD-10-CM

## 2014-11-04 DIAGNOSIS — Z23 Encounter for immunization: Secondary | ICD-10-CM

## 2014-11-04 DIAGNOSIS — E119 Type 2 diabetes mellitus without complications: Secondary | ICD-10-CM | POA: Diagnosis not present

## 2014-11-04 DIAGNOSIS — B079 Viral wart, unspecified: Secondary | ICD-10-CM

## 2014-11-04 LAB — POCT GLYCOSYLATED HEMOGLOBIN (HGB A1C): Hemoglobin A1C: 5.9

## 2014-11-04 NOTE — Assessment & Plan Note (Signed)
Cryotherapy of a wart on the left hand as above.

## 2014-11-04 NOTE — Assessment & Plan Note (Signed)
Labs are well-controlled, LDL is 109, a bit above the target however no changes for now.

## 2014-11-04 NOTE — Assessment & Plan Note (Signed)
Well-controlled and up-to-date on all preventive measures

## 2014-11-04 NOTE — Addendum Note (Signed)
Addended by: Bo Mcclintock C on: 11/04/2014 10:00 AM   Modules accepted: Orders

## 2014-11-04 NOTE — Progress Notes (Signed)
  Subjective:    CC: follow-up  HPI: Hypertension: Well-controlled  Hyperlipidemia: LDL is 109, close to controlled. No adverse effects.  Diabetes mellitus type 2: A1c down to 5.9, patient continues to work aggressively on weight loss.  For: Left hand, desires cryotherapy.  Past medical history, Surgical history, Family history not pertinant except as noted below, Social history, Allergies, and medications have been entered into the medical record, reviewed, and no changes needed.   Review of Systems: No fevers, chills, night sweats, weight loss, chest pain, or shortness of breath.   Objective:    General: Well Developed, well nourished, and in no acute distress.  Neuro: Alert and oriented x3, extra-ocular muscles intact, sensation grossly intact.  HEENT: Normocephalic, atraumatic, pupils equal round reactive to light, neck supple, no masses, no lymphadenopathy, thyroid nonpalpable.  Skin: Warm and dry, no rashes. Cardiac: Regular rate and rhythm, no murmurs rubs or gallops, no lower extremity edema.  Respiratory: Clear to auscultation bilaterally. Not using accessory muscles, speaking in full sentences.  Procedure:  Cryodestruction of left hands wart Consent obtained and verified. Time-out conducted. Noted no overlying erythema, induration, or other signs of local infection. Completed without difficulty using Cryo-Gun. Advised to call if fevers/chills, erythema, induration, drainage, or persistent bleeding.  Impression and Recommendations:

## 2015-03-23 ENCOUNTER — Encounter: Payer: Self-pay | Admitting: Sports Medicine

## 2015-03-23 ENCOUNTER — Ambulatory Visit (INDEPENDENT_AMBULATORY_CARE_PROVIDER_SITE_OTHER): Payer: Managed Care, Other (non HMO) | Admitting: Sports Medicine

## 2015-03-23 ENCOUNTER — Ambulatory Visit (INDEPENDENT_AMBULATORY_CARE_PROVIDER_SITE_OTHER): Payer: Managed Care, Other (non HMO)

## 2015-03-23 VITALS — BP 123/77 | HR 73 | Resp 18 | Wt 214.9 lb

## 2015-03-23 DIAGNOSIS — L918 Other hypertrophic disorders of the skin: Secondary | ICD-10-CM | POA: Diagnosis not present

## 2015-03-23 DIAGNOSIS — M19021 Primary osteoarthritis, right elbow: Secondary | ICD-10-CM | POA: Insufficient documentation

## 2015-03-23 DIAGNOSIS — M25521 Pain in right elbow: Secondary | ICD-10-CM

## 2015-03-23 MED ORDER — TRIAMCINOLONE ACETONIDE 0.5 % EX OINT: 1.0000 "application " | TOPICAL_OINTMENT | Freq: Every day | CUTANEOUS | Status: DC

## 2015-03-23 NOTE — Progress Notes (Signed)
  Subjective:    CC:  Right elbow pain  HPI: This is a pleasant 50 year old male, he comes in with a several month history of pain that he localizes on the anterior joint and of his elbow, after a heavy episode working out, no swelling, bruising, pain is moderate, persistent.  Skin tag: Desires removal.  Past medical history, Surgical history, Family history not pertinant except as noted below, Social history, Allergies, and medications have been entered into the medical record, reviewed, and no changes needed.   Review of Systems: No fevers, chills, night sweats, weight loss, chest pain, or shortness of breath.   Objective:    General: Well Developed, well nourished, and in no acute distress.  Neuro: Alert and oriented x3, extra-ocular muscles intact, sensation grossly intact.  HEENT: Normocephalic, atraumatic, pupils equal round reactive to light, neck supple, no masses, no lymphadenopathy, thyroid nonpalpable.  Skin: Warm and dry, no rashes.  Skin tag on the right neck Cardiac: Regular rate and rhythm, no murmurs rubs or gallops, no lower extremity edema.  Respiratory: Clear to auscultation bilaterally. Not using accessory muscles, speaking in full sentences. Right Elbow: Unremarkable to inspection. Range of motion full pronation, supination, flexion, extension. Strength is full to all of the above directions Stable to varus, valgus stress. Negative moving valgus stress test. Tender to palpation over the distal brachialis tendon Ulnar nerve does not sublux. Negative cubital tunnel Tinel's.  Procedure:  Cryodestruction of right neck skin tag Consent obtained and verified. Time-out conducted. Noted no overlying erythema, induration, or other signs of local infection. Completed without difficulty using Cryo-Gun. Advised to call if fevers/chills, erythema, induration, drainage, or persistent bleeding.  Impression and Recommendations:

## 2015-03-23 NOTE — Assessment & Plan Note (Signed)
Clinically represents a brachialis strain, there is likely element of elbow osteoarthritis. Elbow sleeve, diclofenac, rehabilitation exercises, x-rays. Return to see me in 3 weeks, further management will depend on imaging results.

## 2015-03-23 NOTE — Assessment & Plan Note (Signed)
Cryotherapy as above. 

## 2015-04-13 ENCOUNTER — Ambulatory Visit: Payer: Managed Care, Other (non HMO) | Admitting: Sports Medicine

## 2015-04-20 ENCOUNTER — Ambulatory Visit (INDEPENDENT_AMBULATORY_CARE_PROVIDER_SITE_OTHER): Payer: Managed Care, Other (non HMO) | Admitting: Sports Medicine

## 2015-04-20 VITALS — BP 121/84 | HR 56 | Wt 212.6 lb

## 2015-04-20 DIAGNOSIS — M19021 Primary osteoarthritis, right elbow: Secondary | ICD-10-CM | POA: Diagnosis not present

## 2015-04-20 MED ORDER — DICLOFENAC SODIUM 75 MG PO TBEC: 75.0000 mg | DELAYED_RELEASE_TABLET | Freq: Two times a day (BID) | ORAL | Status: DC

## 2015-04-20 NOTE — Progress Notes (Signed)
  Subjective:    CC:  Follow-up  HPI: Right elbow pain: initially resembled brachialis strain, x-ray showed intra-articular degenerative change, unfortunately has not responded to conservative measures, pain is moderate, persistent agreeable to proceed with interventional treatment today.  Past medical history, Surgical history, Family history not pertinant except as noted below, Social history, Allergies, and medications have been entered into the medical record, reviewed, and no changes needed.   Review of Systems: No fevers, chills, night sweats, weight loss, chest pain, or shortness of breath.   Objective:    General: Well Developed, well nourished, and in no acute distress.  Neuro: Alert and oriented x3, extra-ocular muscles intact, sensation grossly intact.  HEENT: Normocephalic, atraumatic, pupils equal round reactive to light, neck supple, no masses, no lymphadenopathy, thyroid nonpalpable.  Skin: Warm and dry, no rashes. Cardiac: Regular rate and rhythm, no murmurs rubs or gallops, no lower extremity edema.  Respiratory: Clear to auscultation bilaterally. Not using accessory muscles, speaking in full sentences.  Procedure: Real-time Ultrasound Guided Injection of right elbow Device: GE Logiq E  Verbal informed consent obtained.  Time-out conducted.  Noted no overlying erythema, induration, or other signs of local infection.  Skin prepped in a sterile fashion.  Local anesthesia: Topical Ethyl chloride.  With sterile technique and under real time ultrasound guidance:  Positioned the ultrasound probe between the lateral epicondyle and the olecranon, 25-gauge needle advanced through the anconeus muscle into the joint and 1 mL kenalog 40, 1 mL lidocaine, 1 mL Marcaine injected easily. Completed without difficulty  Pain immediately resolved suggesting accurate placement of the medication.  Advised to call if fevers/chills, erythema, induration, drainage, or persistent bleeding.    Images permanently stored and available for review in the ultrasound unit.  Impression: Technically successful ultrasound guided injection.  Impression and Recommendations:

## 2015-04-20 NOTE — Assessment & Plan Note (Signed)
Failure of conservative treatment, injection as above, return to see me in one month, MRI if no better.

## 2015-05-04 ENCOUNTER — Ambulatory Visit (INDEPENDENT_AMBULATORY_CARE_PROVIDER_SITE_OTHER): Payer: Managed Care, Other (non HMO) | Admitting: Sports Medicine

## 2015-05-04 ENCOUNTER — Encounter: Payer: Self-pay | Admitting: Sports Medicine

## 2015-05-04 VITALS — BP 104/67 | HR 78 | Resp 16 | Wt 208.7 lb

## 2015-05-04 DIAGNOSIS — E119 Type 2 diabetes mellitus without complications: Secondary | ICD-10-CM

## 2015-05-04 DIAGNOSIS — E669 Obesity, unspecified: Secondary | ICD-10-CM

## 2015-05-04 DIAGNOSIS — M19021 Primary osteoarthritis, right elbow: Secondary | ICD-10-CM

## 2015-05-04 LAB — POCT GLYCOSYLATED HEMOGLOBIN (HGB A1C): Hemoglobin A1C: 5.8

## 2015-05-04 MED ORDER — PHENTERMINE HCL 37.5 MG PO TABS: ORAL_TABLET | ORAL | Status: DC

## 2015-05-04 NOTE — Progress Notes (Signed)
  Subjective:    CC: Follow-up  HPI: Right elbow osteoarthritis: Resolved after injection last month  Diabetes mellitus type 2: Hemoglobin A1c is down to 5.8.  Obesity: Desires help with weight loss.  Past medical history, Surgical history, Family history not pertinant except as noted below, Social history, Allergies, and medications have been entered into the medical record, reviewed, and no changes needed.   Review of Systems: No fevers, chills, night sweats, weight loss, chest pain, or shortness of breath.   Objective:    General: Well Developed, well nourished, and in no acute distress.  Neuro: Alert and oriented x3, extra-ocular muscles intact, sensation grossly intact.  HEENT: Normocephalic, atraumatic, pupils equal round reactive to light, neck supple, no masses, no lymphadenopathy, thyroid nonpalpable.  Skin: Warm and dry, no rashes. Cardiac: Regular rate and rhythm, no murmurs rubs or gallops, no lower extremity edema.  Respiratory: Clear to auscultation bilaterally. Not using accessory muscles, speaking in full sentences. Right Elbow: Unremarkable to inspection. Range of motion full pronation, supination, flexion, extension. Strength is full to all of the above directions Stable to varus, valgus stress. Negative moving valgus stress test. No discrete areas of tenderness to palpation. Ulnar nerve does not sublux. Negative cubital tunnel Tinel's.  Impression and Recommendations:    I spent 25 minutes with this patient, greater than 50% was face-to-face time counseling regarding the above diagnoses

## 2015-05-04 NOTE — Assessment & Plan Note (Signed)
Well-controlled, no changes needed 

## 2015-05-04 NOTE — Assessment & Plan Note (Signed)
Resolved after injection of month ago

## 2015-05-04 NOTE — Addendum Note (Signed)
Addended by: Elizabeth Sauer on: 05/04/2015 10:10 AM   Modules accepted: Orders

## 2015-05-04 NOTE — Assessment & Plan Note (Signed)
Starting weight loss treatment.  Risks, benefits explained. Return in one month for a weight check.

## 2015-05-18 ENCOUNTER — Ambulatory Visit: Payer: Managed Care, Other (non HMO) | Admitting: Sports Medicine

## 2015-06-08 ENCOUNTER — Encounter: Payer: Self-pay | Admitting: Sports Medicine

## 2015-06-08 ENCOUNTER — Ambulatory Visit (INDEPENDENT_AMBULATORY_CARE_PROVIDER_SITE_OTHER): Payer: Managed Care, Other (non HMO) | Admitting: Sports Medicine

## 2015-06-08 VITALS — BP 105/67 | HR 71 | Resp 18 | Wt 205.8 lb

## 2015-06-08 DIAGNOSIS — E669 Obesity, unspecified: Secondary | ICD-10-CM

## 2015-06-08 MED ORDER — TAMSULOSIN HCL 0.4 MG PO CAPS: 0.4000 mg | ORAL_CAPSULE | Freq: Every day | ORAL | Status: DC

## 2015-06-08 MED ORDER — PHENTERMINE HCL 37.5 MG PO TABS: ORAL_TABLET | ORAL | Status: DC

## 2015-06-08 NOTE — Assessment & Plan Note (Signed)
Good weight loss, refilling phentermine as we enter the second month, adding Flomax, having a bit of obstructive uropathy.

## 2015-06-08 NOTE — Progress Notes (Signed)
  Subjective:    CC: Follow-up  HPI: Obesity: Moderate continued weight loss after the second month of phentermine, no side effects with the exception of some difficulty voiding.  Past medical history, Surgical history, Family history not pertinant except as noted below, Social history, Allergies, and medications have been entered into the medical record, reviewed, and no changes needed.   Review of Systems: No fevers, chills, night sweats, weight loss, chest pain, or shortness of breath.   Objective:    General: Well Developed, well nourished, and in no acute distress.  Neuro: Alert and oriented x3, extra-ocular muscles intact, sensation grossly intact.  HEENT: Normocephalic, atraumatic, pupils equal round reactive to light, neck supple, no masses, no lymphadenopathy, thyroid nonpalpable.  Skin: Warm and dry, no rashes. Cardiac: Regular rate and rhythm, no murmurs rubs or gallops, no lower extremity edema.  Respiratory: Clear to auscultation bilaterally. Not using accessory muscles, speaking in full sentences.  Impression and Recommendations:    I spent 25 minutes with this patient, greater than 50% was face-to-face time counseling regarding the above diagnoses

## 2015-07-05 ENCOUNTER — Ambulatory Visit (INDEPENDENT_AMBULATORY_CARE_PROVIDER_SITE_OTHER): Payer: Managed Care, Other (non HMO) | Admitting: Sports Medicine

## 2015-07-05 ENCOUNTER — Encounter: Payer: Self-pay | Admitting: Sports Medicine

## 2015-07-05 VITALS — BP 93/58 | HR 84 | Resp 16 | Wt 196.8 lb

## 2015-07-05 DIAGNOSIS — E669 Obesity, unspecified: Secondary | ICD-10-CM

## 2015-07-05 MED ORDER — PHENTERMINE HCL 37.5 MG PO TABS
ORAL_TABLET | ORAL | Status: DC
Start: 2015-07-05 — End: 2015-08-13

## 2015-07-05 NOTE — Assessment & Plan Note (Signed)
Good continued weight loss has been to the third month. Obstructive uropathy has improved with Flomax.

## 2015-07-05 NOTE — Progress Notes (Signed)
  Subjective:    CC: Weight check  HPI: Joel Perry returns, he's lost 9 pounds after the last month, happy with how things are going, urinary obstructive symptoms have resolved with Flomax.  Past medical history, Surgical history, Family history not pertinant except as noted below, Social history, Allergies, and medications have been entered into the medical record, reviewed, and no changes needed.   Review of Systems: No fevers, chills, night sweats, weight loss, chest pain, or shortness of breath.   Objective:    General: Well Developed, well nourished, and in no acute distress.  Neuro: Alert and oriented x3, extra-ocular muscles intact, sensation grossly intact.  HEENT: Normocephalic, atraumatic, pupils equal round reactive to light, neck supple, no masses, no lymphadenopathy, thyroid nonpalpable.  Skin: Warm and dry, no rashes. Cardiac: Regular rate and rhythm, no murmurs rubs or gallops, no lower extremity edema.  Respiratory: Clear to auscultation bilaterally. Not using accessory muscles, speaking in full sentences.  Impression and Recommendations:

## 2015-07-08 ENCOUNTER — Other Ambulatory Visit: Payer: Self-pay

## 2015-07-08 DIAGNOSIS — E669 Obesity, unspecified: Secondary | ICD-10-CM

## 2015-07-08 MED ORDER — TAMSULOSIN HCL 0.4 MG PO CAPS: 0.4000 mg | ORAL_CAPSULE | Freq: Every day | ORAL | Status: DC

## 2015-08-02 ENCOUNTER — Ambulatory Visit: Payer: Managed Care, Other (non HMO) | Admitting: Sports Medicine

## 2015-08-13 ENCOUNTER — Encounter: Payer: Self-pay | Admitting: Sports Medicine

## 2015-08-13 ENCOUNTER — Ambulatory Visit (INDEPENDENT_AMBULATORY_CARE_PROVIDER_SITE_OTHER): Payer: Managed Care, Other (non HMO) | Admitting: Sports Medicine

## 2015-08-13 DIAGNOSIS — E669 Obesity, unspecified: Secondary | ICD-10-CM

## 2015-08-13 NOTE — Assessment & Plan Note (Signed)
Good continued weight loss, he is at his goal weight. I have emphasized continuing to limit carbohydrates, he is looking to put on some more bulk in the gym, only doing supplemental protein, advised to add a bit of creatine in a load and then maintenance and maintain hydration.

## 2015-08-13 NOTE — Progress Notes (Signed)
  Subjective:    CC:  Follow-up  HPI: Abnormal weight gain: Kamali has hit his goal weight and has since stopped phentermine. No complaints.  Past medical history, Surgical history, Family history not pertinant except as noted below, Social history, Allergies, and medications have been entered into the medical record, reviewed, and no changes needed.   Review of Systems: No fevers, chills, night sweats, weight loss, chest pain, or shortness of breath.   Objective:    General: Well Developed, well nourished, and in no acute distress.  Neuro: Alert and oriented x3, extra-ocular muscles intact, sensation grossly intact.  HEENT: Normocephalic, atraumatic, pupils equal round reactive to light, neck supple, no masses, no lymphadenopathy, thyroid nonpalpable.  Skin: Warm and dry, no rashes. Cardiac: Regular rate and rhythm, no murmurs rubs or gallops, no lower extremity edema.  Respiratory: Clear to auscultation bilaterally. Not using accessory muscles, speaking in full sentences.  Impression and Recommendations:    Obesity Good continued weight loss, he is at his goal weight. I have emphasized continuing to limit carbohydrates, he is looking to put on some more bulk in the gym, only doing supplemental protein, advised to add a bit of creatine in a load and then maintenance and maintain hydration.

## 2016-02-07 ENCOUNTER — Telehealth: Payer: Self-pay | Admitting: Sports Medicine

## 2016-02-07 NOTE — Telephone Encounter (Signed)
Called pt about flu shot (Declined)

## 2016-02-08 NOTE — Telephone Encounter (Signed)
Documented

## 2016-05-17 ENCOUNTER — Encounter: Payer: Self-pay | Admitting: Sports Medicine

## 2016-05-17 ENCOUNTER — Ambulatory Visit (INDEPENDENT_AMBULATORY_CARE_PROVIDER_SITE_OTHER): Payer: 59 | Admitting: Sports Medicine

## 2016-05-17 DIAGNOSIS — E119 Type 2 diabetes mellitus without complications: Secondary | ICD-10-CM

## 2016-05-17 DIAGNOSIS — Z0000 Encounter for general adult medical examination without abnormal findings: Secondary | ICD-10-CM

## 2016-05-17 DIAGNOSIS — B353 Tinea pedis: Secondary | ICD-10-CM | POA: Insufficient documentation

## 2016-05-17 DIAGNOSIS — E6609 Other obesity due to excess calories: Secondary | ICD-10-CM

## 2016-05-17 LAB — POCT GLYCOSYLATED HEMOGLOBIN (HGB A1C): Hemoglobin A1C: 5.8

## 2016-05-17 MED ORDER — PHENTERMINE HCL 37.5 MG PO TABS: ORAL_TABLET | ORAL | 0 refills | Status: DC

## 2016-05-17 MED ORDER — CLOTRIMAZOLE-BETAMETHASONE 1-0.05 % EX CREA: 1.0000 "application " | TOPICAL_CREAM | Freq: Two times a day (BID) | CUTANEOUS | 0 refills | Status: DC

## 2016-05-17 NOTE — Progress Notes (Signed)
  Subjective:    CC: Follow-up  HPI: Diabetes mellitus type 2: Well controlled, due for some screening measures.  Obesity: Desires to restart phentermine.  Skin rash: Right foot, medial aspect of the heel, mild rash with significant pruritus.  Past medical history:  Negative.  See flowsheet/record as well for more information.  Surgical history: Negative.  See flowsheet/record as well for more information.  Family history: Negative.  See flowsheet/record as well for more information.  Social history: Negative.  See flowsheet/record as well for more information.  Allergies, and medications have been entered into the medical record, reviewed, and no changes needed.   Review of Systems: No fevers, chills, night sweats, weight loss, chest pain, or shortness of breath.   Objective:    General: Well Developed, well nourished, and in no acute distress.  Neuro: Alert and oriented x3, extra-ocular muscles intact, sensation grossly intact.  HEENT: Normocephalic, atraumatic, pupils equal round reactive to light, neck supple, no masses, no lymphadenopathy, thyroid nonpalpable.  Skin: Warm and dry, There appears to be some tinea pedis at the medial heel. Cardiac: Regular rate and rhythm, no murmurs rubs or gallops, no lower extremity edema.  Respiratory: Clear to auscultation bilaterally. Not using accessory muscles, speaking in full sentences.  Diabetic Foot Exam Both feet were examined, there are no signs of ulceration or abnormal callus. Nails are unremarkable. Dorsalis pedis and posterior tibial pulses are palpable. Sensation is intact to sharp and monofilament. Shoes are of appropriate fitment.  Impression and Recommendations:    Diabetes mellitus type 2, controlled, without complications Well-controlled, foot exam normal, eye exam was in February. Checking urine microalbumin and other routine blood work.  Obesity Restarting phentermine.  Tinea pedis Topical Lotrisone.  Annual  physical exam Checking HIV for routine screening purposes

## 2016-05-17 NOTE — Addendum Note (Signed)
Addended by: Elizabeth Sauer on: 05/17/2016 08:52 AM   Modules accepted: Orders

## 2016-05-17 NOTE — Assessment & Plan Note (Signed)
Checking HIV for routine screening purposes

## 2016-05-17 NOTE — Assessment & Plan Note (Signed)
Topical Lotrisone.

## 2016-05-17 NOTE — Assessment & Plan Note (Signed)
Well-controlled, foot exam normal, eye exam was in February. Checking urine microalbumin and other routine blood work.

## 2016-05-17 NOTE — Assessment & Plan Note (Signed)
Restarting phentermine. 

## 2016-05-19 ENCOUNTER — Telehealth: Payer: Self-pay

## 2016-05-19 MED ORDER — TAMSULOSIN HCL 0.4 MG PO CAPS: 0.4000 mg | ORAL_CAPSULE | Freq: Every day | ORAL | 3 refills | Status: DC

## 2016-05-19 NOTE — Telephone Encounter (Signed)
Done

## 2016-05-19 NOTE — Telephone Encounter (Signed)
Pt would like to have a refill of Flomax while taking the phentermine. Please advise

## 2016-05-23 LAB — CBC
HCT: 45.1 % (ref 38.5–50.0)
Hemoglobin: 13.9 g/dL (ref 13.2–17.1)
MCH: 25 pg — ABNORMAL LOW (ref 27.0–33.0)
MCHC: 30.8 g/dL — ABNORMAL LOW (ref 32.0–36.0)
MCV: 81.3 fL (ref 80.0–100.0)
MPV: 10.7 fL (ref 7.5–12.5)
Platelets: 181 K/uL (ref 140–400)
RBC: 5.55 MIL/uL (ref 4.20–5.80)
RDW: 15.2 % — ABNORMAL HIGH (ref 11.0–15.0)
WBC: 3.9 K/uL (ref 3.8–10.8)

## 2016-05-24 LAB — COMPREHENSIVE METABOLIC PANEL WITH GFR
ALT: 24 U/L (ref 9–46)
AST: 24 U/L (ref 10–40)
BUN: 17 mg/dL (ref 7–25)
Glucose, Bld: 93 mg/dL (ref 65–99)
Total Bilirubin: 0.7 mg/dL (ref 0.2–1.2)
Total Protein: 7.3 g/dL (ref 6.1–8.1)

## 2016-05-24 LAB — COMPREHENSIVE METABOLIC PANEL
Albumin: 4.5 g/dL (ref 3.6–5.1)
Alkaline Phosphatase: 65 U/L (ref 40–115)
CO2: 25 mmol/L (ref 20–31)
Calcium: 9.7 mg/dL (ref 8.6–10.3)
Chloride: 103 mmol/L (ref 98–110)
Creat: 1.24 mg/dL (ref 0.60–1.35)
Potassium: 4.3 mmol/L (ref 3.5–5.3)
Sodium: 139 mmol/L (ref 135–146)

## 2016-05-24 LAB — LIPID PANEL W/REFLEX DIRECT LDL
Cholesterol: 183 mg/dL (ref <200)
HDL: 58 mg/dL (ref >40)
LDL-Cholesterol: 109 mg/dL — ABNORMAL HIGH
Non-HDL Cholesterol (Calc): 125 mg/dL (ref <130)
Total CHOL/HDL Ratio: 3.2 Ratio (ref <5.0)
Triglycerides: 70 mg/dL (ref <150)

## 2016-05-24 LAB — HIV ANTIBODY (ROUTINE TESTING W REFLEX): HIV 1&2 Ab, 4th Generation: NONREACTIVE

## 2016-05-24 LAB — MICROALBUMIN / CREATININE URINE RATIO
Creatinine, Urine: 191 mg/dL (ref 20–370)
Microalb Creat Ratio: 2 mcg/mg creat (ref <30)
Microalb, Ur: 0.4 mg/dL

## 2016-05-24 LAB — TSH: TSH: 1.52 mIU/L (ref 0.40–4.50)

## 2016-06-14 ENCOUNTER — Ambulatory Visit (INDEPENDENT_AMBULATORY_CARE_PROVIDER_SITE_OTHER): Payer: 59 | Admitting: Sports Medicine

## 2016-06-14 DIAGNOSIS — E6609 Other obesity due to excess calories: Secondary | ICD-10-CM | POA: Diagnosis not present

## 2016-06-14 MED ORDER — PHENTERMINE HCL 37.5 MG PO TABS: ORAL_TABLET | ORAL | 0 refills | Status: DC

## 2016-06-14 MED ORDER — TAMSULOSIN HCL 0.4 MG PO CAPS: 0.4000 mg | ORAL_CAPSULE | Freq: Every day | ORAL | 3 refills | Status: DC

## 2016-06-14 NOTE — Progress Notes (Signed)
  Subjective:    CC: Follow-up  HPI: Obesity: 7 pound weight loss after the first month on phentermine.  Past medical history:  Negative.  See flowsheet/record as well for more information.  Surgical history: Negative.  See flowsheet/record as well for more information.  Family history: Negative.  See flowsheet/record as well for more information.  Social history: Negative.  See flowsheet/record as well for more information.  Allergies, and medications have been entered into the medical record, reviewed, and no changes needed.   Review of Systems: No fevers, chills, night sweats, weight loss, chest pain, or shortness of breath.   Objective:    General: Well Developed, well nourished, and in no acute distress.  Neuro: Alert and oriented x3, extra-ocular muscles intact, sensation grossly intact.  HEENT: Normocephalic, atraumatic, pupils equal round reactive to light, neck supple, no masses, no lymphadenopathy, thyroid nonpalpable.  Skin: Warm and dry, no rashes. Cardiac: Regular rate and rhythm, no murmurs rubs or gallops, no lower extremity edema.  Respiratory: Clear to auscultation bilaterally. Not using accessory muscles, speaking in full sentences.  Impression and Recommendations:    Obesity 7 weight loss after the first month, refilling medication, return in one month.

## 2016-06-14 NOTE — Assessment & Plan Note (Signed)
7 weight loss after the first month, refilling medication, return in one month.

## 2016-07-20 ENCOUNTER — Ambulatory Visit (INDEPENDENT_AMBULATORY_CARE_PROVIDER_SITE_OTHER): Payer: 59 | Admitting: Sports Medicine

## 2016-07-20 ENCOUNTER — Encounter: Payer: Self-pay | Admitting: Sports Medicine

## 2016-07-20 DIAGNOSIS — M19021 Primary osteoarthritis, right elbow: Secondary | ICD-10-CM

## 2016-07-20 DIAGNOSIS — E6609 Other obesity due to excess calories: Secondary | ICD-10-CM | POA: Diagnosis not present

## 2016-07-20 MED ORDER — DICLOFENAC SODIUM 75 MG PO TBEC: 75.0000 mg | DELAYED_RELEASE_TABLET | Freq: Two times a day (BID) | ORAL | 11 refills | Status: DC

## 2016-07-20 MED ORDER — TAMSULOSIN HCL 0.4 MG PO CAPS: 0.4000 mg | ORAL_CAPSULE | Freq: Every day | ORAL | 3 refills | Status: DC

## 2016-07-20 MED ORDER — PHENTERMINE HCL 37.5 MG PO TABS: ORAL_TABLET | ORAL | 0 refills | Status: DC

## 2016-07-20 NOTE — Assessment & Plan Note (Signed)
Known elbow osteoarthritis, I injected him in April 2017 and he did extremely well until now. Triceps rehabilitation exercises, if no better in a month we will do another injection. Voltaren for pain.

## 2016-07-20 NOTE — Progress Notes (Signed)
  Subjective:    CC: Follow-up  HPI: Hypertension: Well controlled  Obstructive uropathy, iatrogenic: Well-controlled on Flomax, needs a refill.  Abnormal weight gain: Only 1 pound weight loss after the second month on phentermine, would like to try an additional month.  Right elbow pain: Known osteoarthritis, recent injection was a year and a half ago. Now having recurrence of pain.  Past medical history:  Negative.  See flowsheet/record as well for more information.  Surgical history: Negative.  See flowsheet/record as well for more information.  Family history: Negative.  See flowsheet/record as well for more information.  Social history: Negative.  See flowsheet/record as well for more information.  Allergies, and medications have been entered into the medical record, reviewed, and no changes needed.   Review of Systems: No fevers, chills, night sweats, weight loss, chest pain, or shortness of breath.   Objective:    General: Well Developed, well nourished, and in no acute distress.  Neuro: Alert and oriented x3, extra-ocular muscles intact, sensation grossly intact.  HEENT: Normocephalic, atraumatic, pupils equal round reactive to light, neck supple, no masses, no lymphadenopathy, thyroid nonpalpable.  Skin: Warm and dry, no rashes. Cardiac: Regular rate and rhythm, no murmurs rubs or gallops, no lower extremity edema.  Respiratory: Clear to auscultation bilaterally. Not using accessory muscles, speaking in full sentences. Right Elbow: Unremarkable to inspection. Range of motion full pronation, supination, flexion, extension. Strength is full to all of the above directions Stable to varus, valgus stress. Negative moving valgus stress test. Minimal tenderness at the joint line medially Ulnar nerve does not sublux. Negative cubital tunnel Tinel's.  Impression and Recommendations:    Obesity Good weight loss after the first month on phentermine but plateaued on the second  month. Only a 1 pound weight loss, he will work harder to avoid carbohydrates, we'll add Topamax if he continues to plateau over the next month. He was getting a bit of urinary obstruction with phentermine, with dribbling and weak stream which resolved with Flomax. Return in one month.  Primary osteoarthritis of right elbow Known elbow osteoarthritis, I injected him in April 2017 and he did extremely well until now. Triceps rehabilitation exercises, if no better in a month we will do another injection. Voltaren for pain.

## 2016-07-20 NOTE — Assessment & Plan Note (Addendum)
Good weight loss after the first month on phentermine but plateaued on the second month. Only a 1 pound weight loss, he will work harder to avoid carbohydrates, we'll add Topamax if he continues to plateau over the next month. He was getting a bit of urinary obstruction with phentermine, with dribbling and weak stream which resolved with Flomax. Return in one month.

## 2016-08-17 ENCOUNTER — Ambulatory Visit (INDEPENDENT_AMBULATORY_CARE_PROVIDER_SITE_OTHER): Payer: 59 | Admitting: Sports Medicine

## 2016-08-17 ENCOUNTER — Encounter: Payer: Self-pay | Admitting: Sports Medicine

## 2016-08-17 DIAGNOSIS — E6609 Other obesity due to excess calories: Secondary | ICD-10-CM | POA: Diagnosis not present

## 2016-08-17 DIAGNOSIS — M19021 Primary osteoarthritis, right elbow: Secondary | ICD-10-CM

## 2016-08-17 MED ORDER — TAMSULOSIN HCL 0.4 MG PO CAPS: 0.4000 mg | ORAL_CAPSULE | Freq: Every day | ORAL | 3 refills | Status: DC

## 2016-08-17 MED ORDER — PHENTERMINE HCL 37.5 MG PO TABS: ORAL_TABLET | ORAL | 0 refills | Status: DC

## 2016-08-17 NOTE — Progress Notes (Signed)
  Subjective:    CC: Weight check  HPI: Obesity: 4 pound weight loss, doing well.  Obstructive uropathy: Needs a refill on Flomax  Right elbow osteoarthritis: Not really hurting all that much right now, and injection in the distant past provided good relief.  Past medical history:  Negative.  See flowsheet/record as well for more information.  Surgical history: Negative.  See flowsheet/record as well for more information.  Family history: Negative.  See flowsheet/record as well for more information.  Social history: Negative.  See flowsheet/record as well for more information.  Allergies, and medications have been entered into the medical record, reviewed, and no changes needed.   Review of Systems: No fevers, chills, night sweats, weight loss, chest pain, or shortness of breath.   Objective:    General: Well Developed, well nourished, and in no acute distress.  Neuro: Alert and oriented x3, extra-ocular muscles intact, sensation grossly intact.  HEENT: Normocephalic, atraumatic, pupils equal round reactive to light, neck supple, no masses, no lymphadenopathy, thyroid nonpalpable.  Skin: Warm and dry, no rashes. Cardiac: Regular rate and rhythm, no murmurs rubs or gallops, no lower extremity edema.  Respiratory: Clear to auscultation bilaterally. Not using accessory muscles, speaking in full sentences. Right Elbow: Unremarkable to inspection. Range of motion full pronation, supination, flexion, extension. Strength is full to all of the above directions Stable to varus, valgus stress. Negative moving valgus stress test. No discrete areas of tenderness to palpation. Ulnar nerve does not sublux. Negative cubital tunnel Tinel's.   Impression and Recommendations:    Obesity Entering the fourth month, additional 4 pounds down.  Primary osteoarthritis of right elbow Mild to moderate pain, not bad enough to consider an injection. He will start arthritis strength Tylenol. He can  return as needed for this.  I spent 25 minutes with this patient, greater than 50% was face-to-face time counseling regarding the above diagnoses

## 2016-08-17 NOTE — Assessment & Plan Note (Signed)
Mild to moderate pain, not bad enough to consider an injection. He will start arthritis strength Tylenol. He can return as needed for this.

## 2016-08-17 NOTE — Assessment & Plan Note (Signed)
Entering the fourth month, additional 4 pounds down.

## 2016-09-13 ENCOUNTER — Ambulatory Visit (INDEPENDENT_AMBULATORY_CARE_PROVIDER_SITE_OTHER): Payer: 59 | Admitting: Sports Medicine

## 2016-09-13 DIAGNOSIS — E6609 Other obesity due to excess calories: Secondary | ICD-10-CM | POA: Diagnosis not present

## 2016-09-13 DIAGNOSIS — G5601 Carpal tunnel syndrome, right upper limb: Secondary | ICD-10-CM | POA: Diagnosis not present

## 2016-09-13 MED ORDER — TAMSULOSIN HCL 0.4 MG PO CAPS: 0.4000 mg | ORAL_CAPSULE | Freq: Every day | ORAL | 3 refills | Status: DC

## 2016-09-13 MED ORDER — PHENTERMINE HCL 37.5 MG PO TABS: ORAL_TABLET | ORAL | 0 refills | Status: DC

## 2016-09-13 NOTE — Assessment & Plan Note (Signed)
Target weight has been more than achieved after 4 months of full dose phentermine. Tapering down with a half tab.  Return as needed for this.

## 2016-09-13 NOTE — Assessment & Plan Note (Signed)
Positive Tinel's sign. Nighttime splinting, he will obtain his own nighttime wrist brace. Return to see me in one month.

## 2016-09-13 NOTE — Addendum Note (Signed)
Addended by: Elizabeth Sauer on: 09/13/2016 03:24 PM   Modules accepted: Orders

## 2016-09-13 NOTE — Progress Notes (Signed)
  Subjective:    CC: Follow-up  HPI: Obesity: Good weight loss, he has reached his target weight.  Right hand symptoms: Feels cold after using the mouse work for a while, no numbness or tingling, no symptoms at night. Moderate, persistent.  Past medical history:  Negative.  See flowsheet/record as well for more information.  Surgical history: Negative.  See flowsheet/record as well for more information.  Family history: Negative.  See flowsheet/record as well for more information.  Social history: Negative.  See flowsheet/record as well for more information.  Allergies, and medications have been entered into the medical record, reviewed, and no changes needed.   Review of Systems: No fevers, chills, night sweats, weight loss, chest pain, or shortness of breath.   Objective:    General: Well Developed, well nourished, and in no acute distress.  Neuro: Alert and oriented x3, extra-ocular muscles intact, sensation grossly intact.  HEENT: Normocephalic, atraumatic, pupils equal round reactive to light, neck supple, no masses, no lymphadenopathy, thyroid nonpalpable.  Skin: Warm and dry, no rashes. Cardiac: Regular rate and rhythm, no murmurs rubs or gallops, no lower extremity edema.  Respiratory: Clear to auscultation bilaterally. Not using accessory muscles, speaking in full sentences. Right Wrist: Inspection normal with no visible erythema or swelling. ROM smooth and normal with good flexion and extension and ulnar/radial deviation that is symmetrical with opposite wrist. Palpation is normal over metacarpals, navicular, lunate, and TFCC; tendons without tenderness/ swelling No snuffbox tenderness. No tenderness over Canal of [place]. Strength 5/5 in all directions without pain. Positive tinel's and phalens signs. Negative Finkelstein sign. Negative Watson's test.  Impression and Recommendations:    Obesity Target weight has been more than achieved after 4 months of full dose  phentermine. Tapering down with a half tab.  Return as needed for this.  Carpal tunnel syndrome, right Positive Tinel's sign. Nighttime splinting, he will obtain his own nighttime wrist brace. Return to see me in one month.  ___________________________________________ Gwen Her. Dianah Field, M.D., ABFM., CAQSM. Primary Care and [place] Instructor of [place] of [facility] of Medicine

## 2016-10-11 ENCOUNTER — Encounter: Payer: Self-pay | Admitting: Sports Medicine

## 2016-10-11 ENCOUNTER — Ambulatory Visit (INDEPENDENT_AMBULATORY_CARE_PROVIDER_SITE_OTHER): Payer: 59 | Admitting: Sports Medicine

## 2016-10-11 DIAGNOSIS — Z0000 Encounter for general adult medical examination without abnormal findings: Secondary | ICD-10-CM

## 2016-10-11 DIAGNOSIS — G5601 Carpal tunnel syndrome, right upper limb: Secondary | ICD-10-CM

## 2016-10-11 NOTE — Assessment & Plan Note (Signed)
Up-to-date on all screening measures, return in one year for annual physical exam.

## 2016-10-11 NOTE — Progress Notes (Signed)
  Subjective:    CC: Follow-up  HPI: Carpal tunnel syndrome right sided: Resolved with nighttime splinting.  Past medical history:  Negative.  See flowsheet/record as well for more information.  Surgical history: Negative.  See flowsheet/record as well for more information.  Family history: Negative.  See flowsheet/record as well for more information.  Social history: Negative.  See flowsheet/record as well for more information.  Allergies, and medications have been entered into the medical record, reviewed, and no changes needed.   Review of Systems: No fevers, chills, night sweats, weight loss, chest pain, or shortness of breath.   Objective:    General: Well Developed, well nourished, and in no acute distress.  Neuro: Alert and oriented x3, extra-ocular muscles intact, sensation grossly intact.  HEENT: Normocephalic, atraumatic, pupils equal round reactive to light, neck supple, no masses, no lymphadenopathy, thyroid nonpalpable.  Skin: Warm and dry, no rashes. Cardiac: Regular rate and rhythm, no murmurs rubs or gallops, no lower extremity edema.  Respiratory: Clear to auscultation bilaterally. Not using accessory muscles, speaking in full sentences.  Impression and Recommendations:    Carpal tunnel syndrome, right Resolved with nighttime splinting. Return as needed.  Annual physical exam Up-to-date on all screening measures, return in one year for annual physical exam.  ___________________________________________ Gwen Her. Dianah Field, M.D., ABFM., CAQSM. Primary Care and [place] Instructor of [place] of [facility] of Medicine

## 2016-10-11 NOTE — Assessment & Plan Note (Signed)
Resolved with nighttime splinting. Return as needed.

## 2017-10-11 ENCOUNTER — Ambulatory Visit (INDEPENDENT_AMBULATORY_CARE_PROVIDER_SITE_OTHER): Payer: 59 | Admitting: Sports Medicine

## 2017-10-11 ENCOUNTER — Encounter: Payer: Self-pay | Admitting: Sports Medicine

## 2017-10-11 VITALS — BP 104/72 | HR 71 | Ht 69.0 in | Wt 209.0 lb

## 2017-10-11 DIAGNOSIS — Z125 Encounter for screening for malignant neoplasm of prostate: Secondary | ICD-10-CM

## 2017-10-11 DIAGNOSIS — Z0000 Encounter for general adult medical examination without abnormal findings: Secondary | ICD-10-CM | POA: Diagnosis not present

## 2017-10-11 DIAGNOSIS — Z23 Encounter for immunization: Secondary | ICD-10-CM

## 2017-10-11 DIAGNOSIS — E119 Type 2 diabetes mellitus without complications: Secondary | ICD-10-CM | POA: Diagnosis not present

## 2017-10-11 LAB — POCT GLYCOSYLATED HEMOGLOBIN (HGB A1C): Hemoglobin A1C: 5.7 % — AB (ref 4.0–5.6)

## 2017-10-11 NOTE — Assessment & Plan Note (Signed)
Annual physical as above. Flu shot today, Cologuard testing. Checking routine labs.

## 2017-10-11 NOTE — Assessment & Plan Note (Addendum)
Doing well, hemoglobin A1c well-controlled. Checking some routine labs. Foot exam today, diabetic eye exam was normal in February, urine microalbumin today.

## 2017-10-11 NOTE — Progress Notes (Signed)
Subjective:    CC: Annual physical exam  HPI:  Zackary returns, he is a pleasant 52 year old male, he is due for some screening measures and some labs but otherwise doing well.  I reviewed the past medical history, family history, social history, surgical history, and allergies today and no changes were needed.  Please see the problem list section below in epic for further details.  Past Medical History: Past Medical History:  Diagnosis Date  . Tenonitis    of AC   Past Surgical History: Past Surgical History:  Procedure Laterality Date  . EYE SURGERY Right    Social History: Social History   Socioeconomic History  . Marital status: Married    Spouse name: Arbie Cookey  . Number of children: 2  . Years of education: Not on file  . Highest education level: Not on file  Occupational History  . 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   Social Needs  . Financial resource strain: Not on file  . Food insecurity:    Worry: Not on file    Inability: Not on file  . Transportation needs:    Medical: Not on file    Non-medical: Not on file  Tobacco Use  . Smoking status: Never Smoker  . Smokeless tobacco: Never Used  Substance and Sexual Activity  . Alcohol use: Yes    Alcohol/week: 0.0 standard drinks    Comment: rare  . Drug use: No  . Sexual activity: Not on file  Lifestyle  . Physical activity:    Days per week: Not on file    Minutes per session: Not on file  . Stress: Not on file  Relationships  . Social connections:    Talks on phone: Not on file    Gets together: Not on file    Attends religious service: Not on file    Active member of club or organization: Not on file    Attends meetings of clubs or organizations: Not on file    Relationship status: Not on file  Other Topics Concern  . Not on file  Social History Narrative   Works out regularly every other day.  Some caffeine.     Family History: No family history on file. Allergies: No Known  Allergies Medications: See med rec.  Review of Systems: No headache, visual changes, nausea, vomiting, diarrhea, constipation, dizziness, abdominal pain, skin rash, fevers, chills, night sweats, swollen lymph nodes, weight loss, chest pain, body aches, joint swelling, muscle aches, shortness of breath, mood changes, visual or auditory hallucinations.  Objective:    General: Well Developed, well nourished, and in no acute distress.  Neuro: Alert and oriented x3, extra-ocular muscles intact, sensation grossly intact. Cranial nerves II through XII are intact, motor, sensory, and coordinative functions are all intact. HEENT: Normocephalic, atraumatic, pupils equal round reactive to light, neck supple, no masses, no lymphadenopathy, thyroid nonpalpable. Oropharynx, nasopharynx, external ear canals are unremarkable. Skin: Warm and dry, no rashes noted.  Cardiac: Regular rate and rhythm, no murmurs rubs or gallops.  Respiratory: Clear to auscultation bilaterally. Not using accessory muscles, speaking in full sentences.  Abdominal: Soft, nontender, nondistended, positive bowel sounds, no masses, no organomegaly.  Musculoskeletal: Shoulder, elbow, wrist, hip, knee, ankle stable, and with full range of motion.  Diabetic Foot Exam Both feet were examined, there are no signs of ulceration or abnormal callus. Nails are unremarkable. Dorsalis pedis and posterior tibial pulses are palpable. Sensation is intact to sharp and monofilament. Shoes are  of appropriate fitment.  Impression and Recommendations:    The patient was counselled, risk factors were discussed, anticipatory guidance given.  Annual physical exam Annual physical as above. Flu shot today, Cologuard testing. Checking routine labs.  Diabetes mellitus type 2, controlled, without complications Doing well, hemoglobin A1c well-controlled. Checking some routine labs. Foot exam today, diabetic eye exam was normal in February, urine  microalbumin today.  ___________________________________________ Gwen Her. Dianah Field, M.D., ABFM., CAQSM. Primary Care and [place] Instructor of [place] of [facility] of Medicine

## 2017-10-12 LAB — CBC
HCT: 46.3 % (ref 38.5–50.0)
Hemoglobin: 15.1 g/dL (ref 13.2–17.1)
MCH: 26.2 pg — ABNORMAL LOW (ref 27.0–33.0)
MCHC: 32.6 g/dL (ref 32.0–36.0)
MCV: 80.2 fL (ref 80.0–100.0)
MPV: 11.2 fL (ref 7.5–12.5)
Platelets: 192 10*3/uL (ref 140–400)
RBC: 5.77 Million/uL (ref 4.20–5.80)
RDW: 13.7 % (ref 11.0–15.0)
WBC: 4.3 10*3/uL (ref 3.8–10.8)

## 2017-10-12 LAB — COMPREHENSIVE METABOLIC PANEL WITH GFR
Albumin: 4.5 g/dL (ref 3.6–5.1)
Calcium: 10 mg/dL (ref 8.6–10.3)
Chloride: 101 mmol/L (ref 98–110)
Globulin: 2.8 g/dL (ref 1.9–3.7)
Glucose, Bld: 99 mg/dL (ref 65–139)
Potassium: 4.4 mmol/L (ref 3.5–5.3)
Sodium: 138 mmol/L (ref 135–146)

## 2017-10-12 LAB — LIPID PANEL W/REFLEX DIRECT LDL
Cholesterol: 194 mg/dL (ref <200)
HDL: 55 mg/dL (ref >40)
LDL Cholesterol (Calc): 121 mg/dL — ABNORMAL HIGH
Non-HDL Cholesterol (Calc): 139 mg/dL (calc) — ABNORMAL HIGH (ref <130)
Total CHOL/HDL Ratio: 3.5 (calc) (ref <5.0)
Triglycerides: 81 mg/dL (ref <150)

## 2017-10-12 LAB — COMPREHENSIVE METABOLIC PANEL
AG Ratio: 1.6 (calc) (ref 1.0–2.5)
ALT: 23 U/L (ref 9–46)
AST: 22 U/L (ref 10–35)
Alkaline phosphatase (APISO): 61 U/L (ref 40–115)
BUN: 19 mg/dL (ref 7–25)
CO2: 32 mmol/L (ref 20–32)
Creat: 1.07 mg/dL (ref 0.70–1.33)
Total Bilirubin: 0.5 mg/dL (ref 0.2–1.2)
Total Protein: 7.3 g/dL (ref 6.1–8.1)

## 2017-10-12 LAB — TSH: TSH: 1.3 mIU/L (ref 0.40–4.50)

## 2017-10-12 LAB — PSA, TOTAL AND FREE
PSA, % Free: 20 % (calc) — ABNORMAL LOW (ref >25)
PSA, Free: 0.1 ng/mL
PSA, Total: 0.5 ng/mL (ref <=4.0)

## 2017-11-01 LAB — COLOGUARD: Cologuard: NEGATIVE

## 2017-11-13 IMAGING — CR DG ELBOW COMPLETE 3+V*R*
4 series · 4 of 4 positions shown · non-contrast
Comparison: None in PACs

CLINICAL DATA: Possible injury of right elbow 2 weeks ago while
lifting weights; patient reports right anterior medial elbow pain
with no other complaints.

EXAM:
RIGHT ELBOW - COMPLETE 3+ VIEW

[elbow ap]
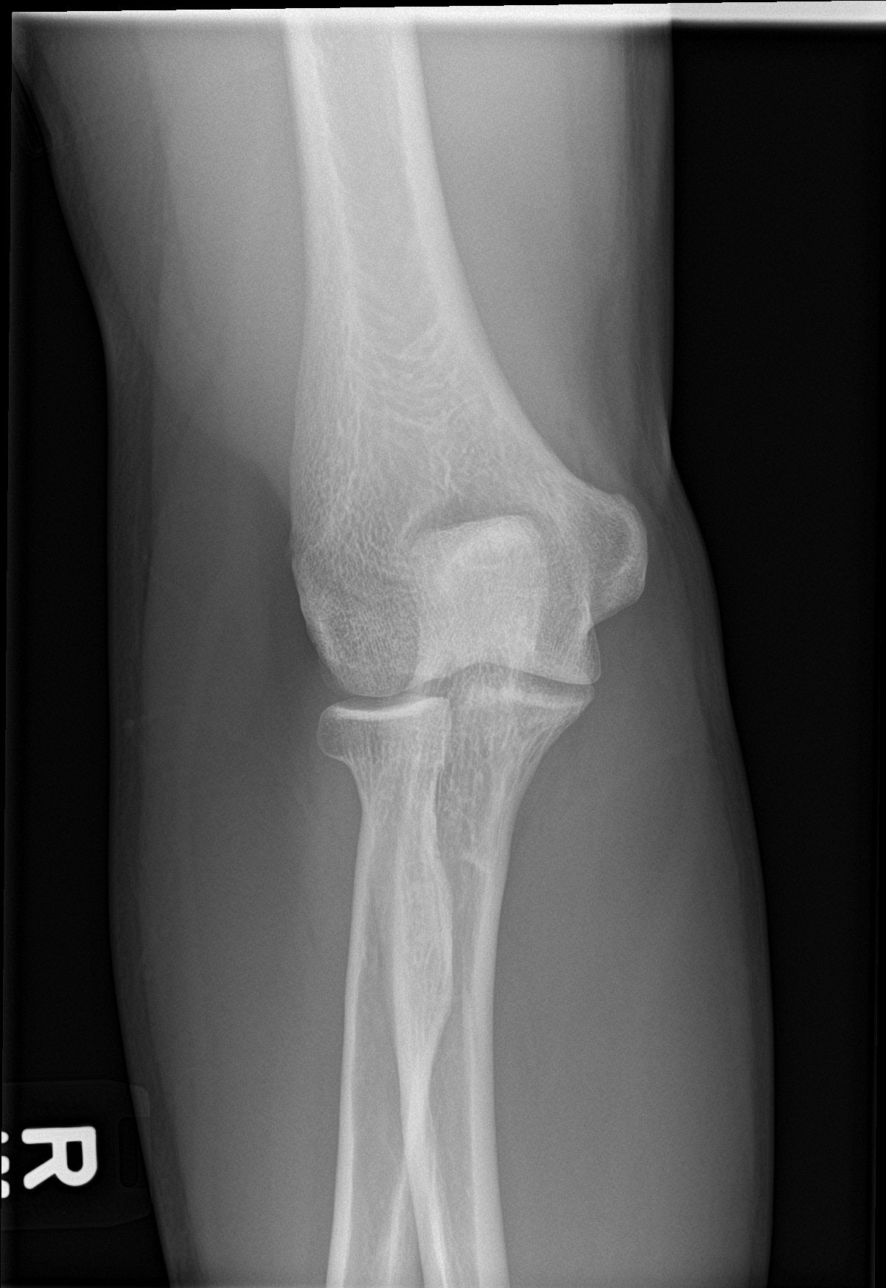

[elbow lat]
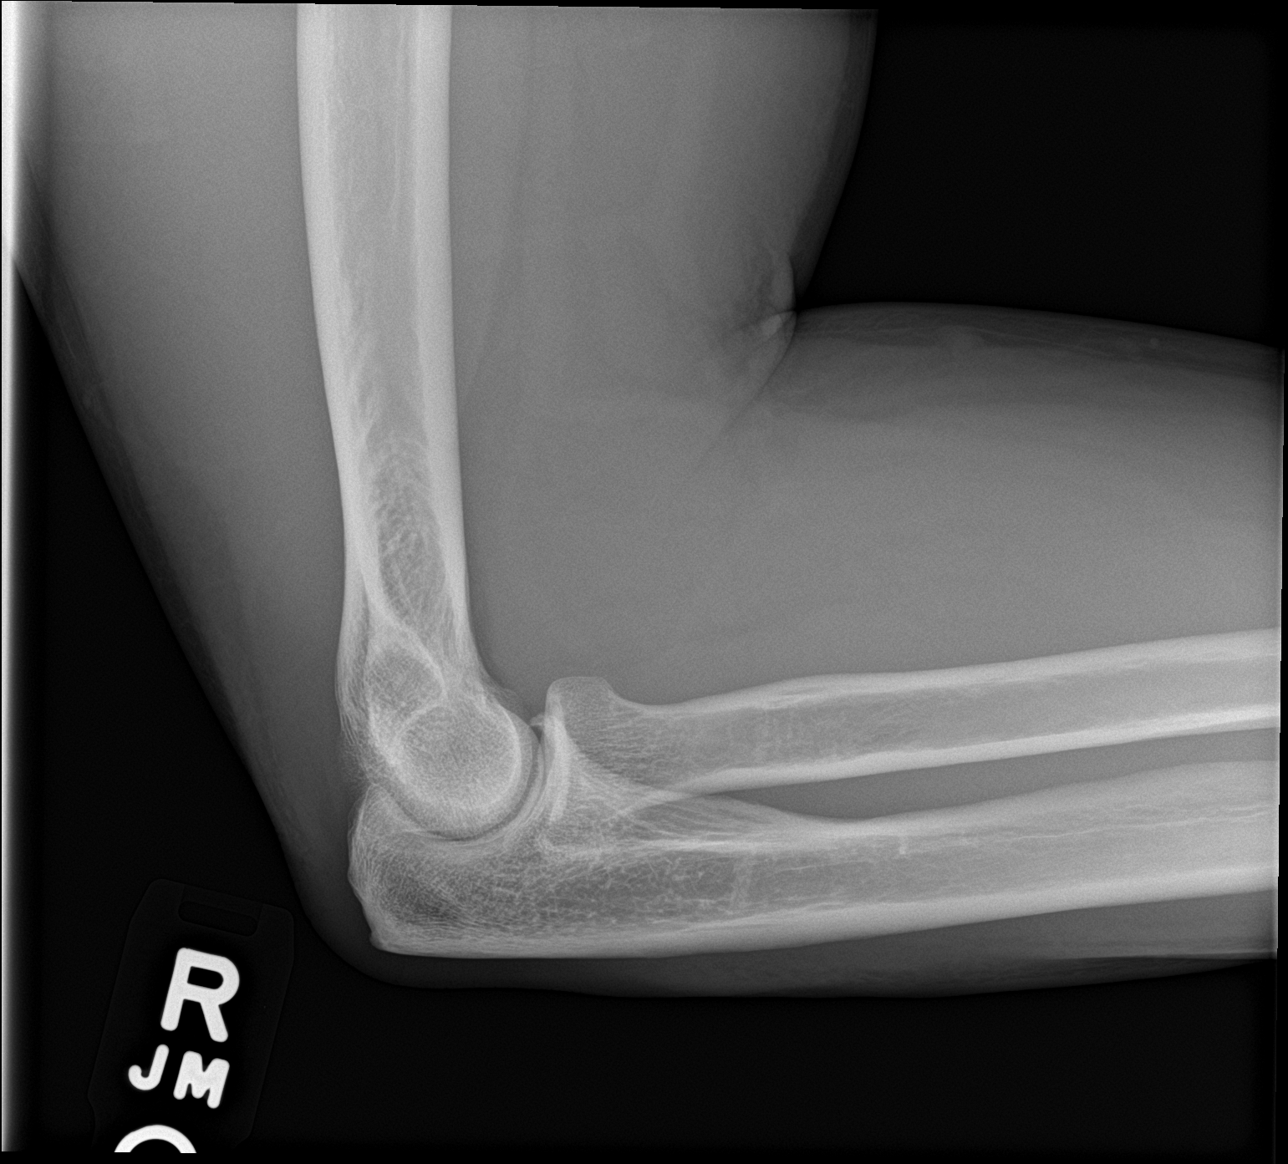

[elbow obl (1 of 2)]
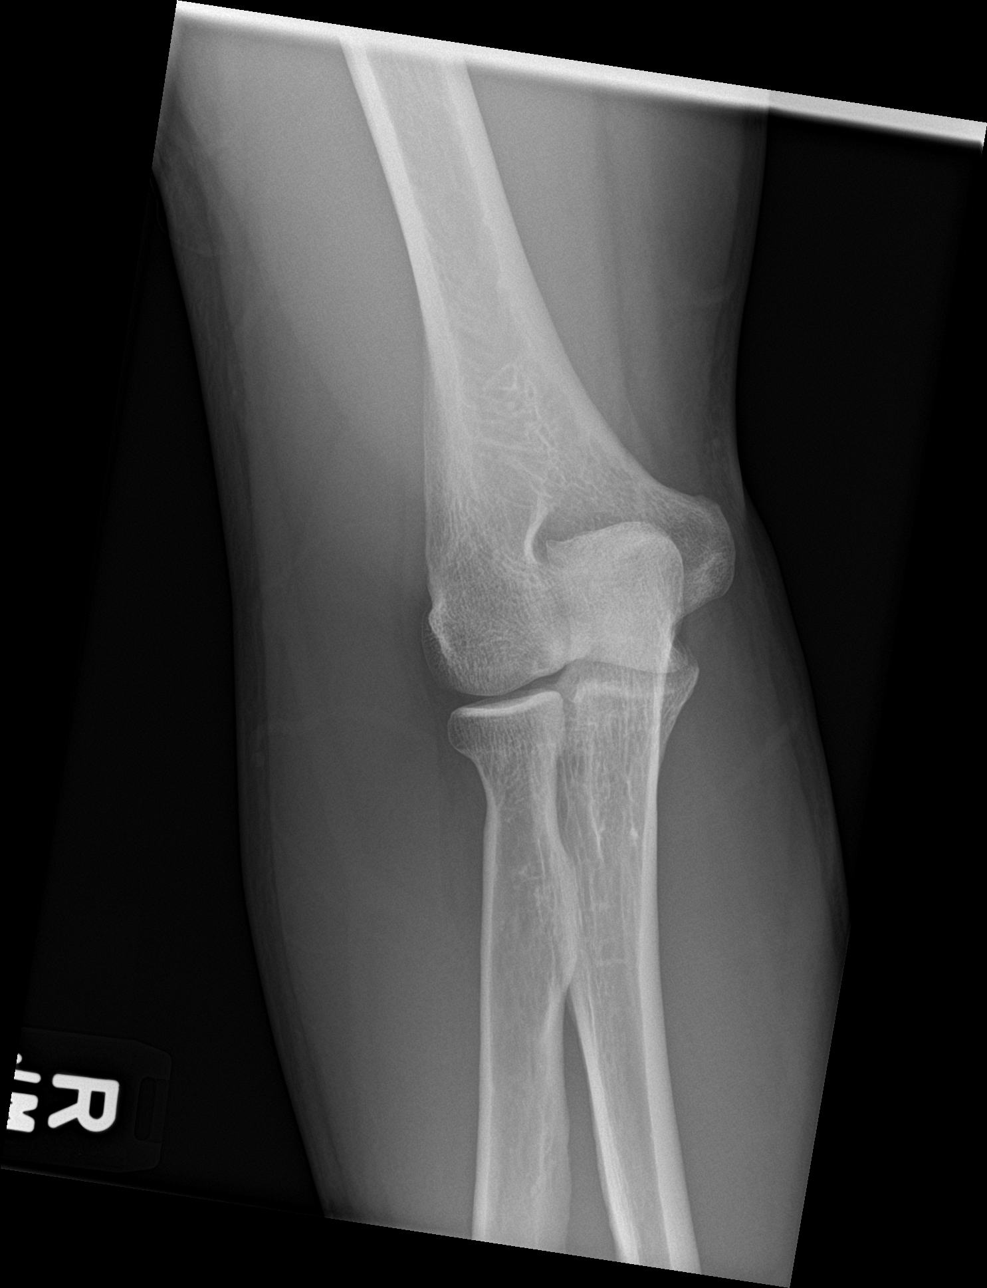

[elbow obl (2 of 2)]
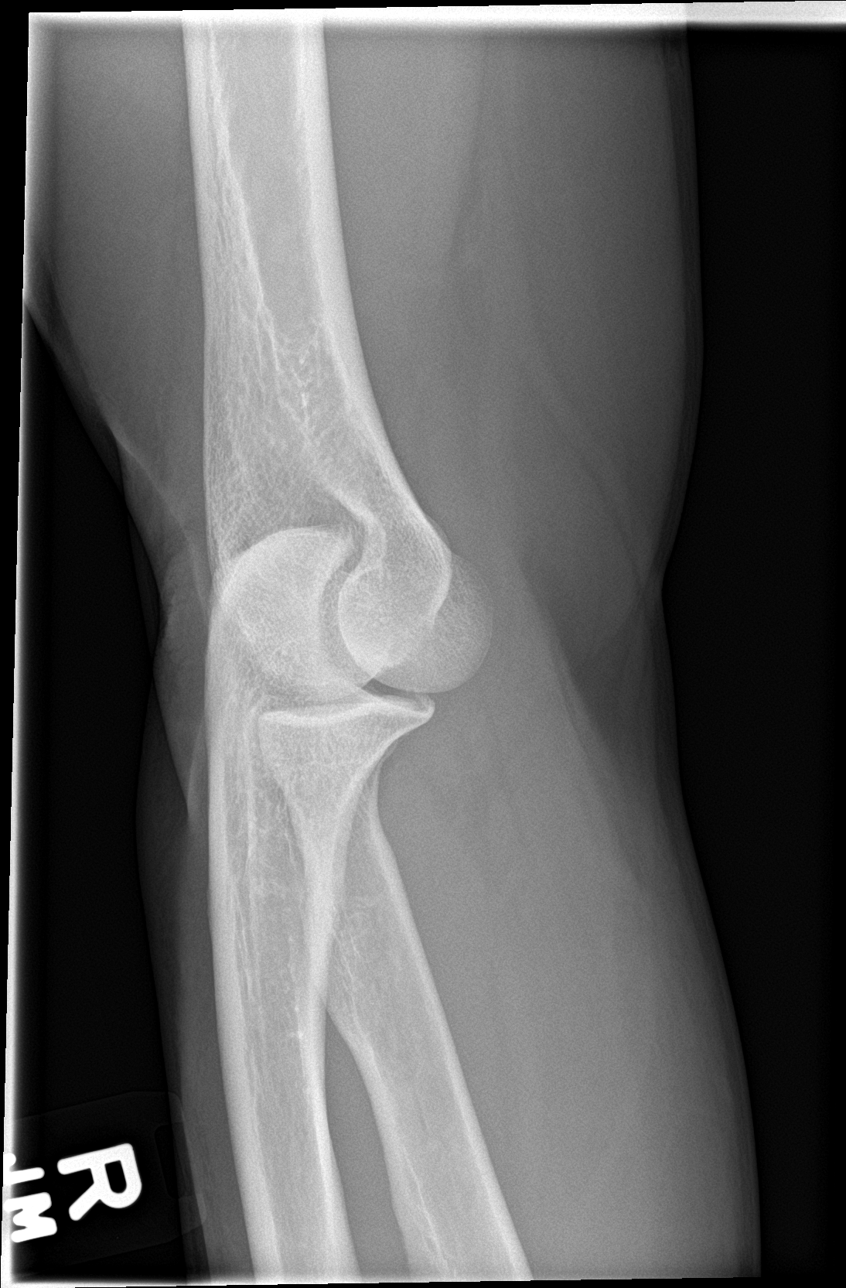

[4 of 4 positions shown; findings below may reference images not displayed]

FINDINGS: The bones of the elbow are adequately mineralized. There is no acute
or healing fracture. The joint spaces are preserved. There is a
small spur arising from the tip of the coronoid process of the
olecranon. There is also a small posterior olecranon spur. The
radial head and the distal humerus are intact. There is no joint
effusion.
IMPRESSION: There are mild degenerative changes of the elbow manifested by tiny
spurs involving the coronoid process and the posterior aspect of the
olecranon. There is no acute bony abnormality.

## 2017-11-19 ENCOUNTER — Encounter: Payer: Self-pay | Admitting: Sports Medicine

## 2018-01-21 ENCOUNTER — Encounter: Payer: Self-pay | Admitting: Sports Medicine

## 2018-01-21 ENCOUNTER — Ambulatory Visit (INDEPENDENT_AMBULATORY_CARE_PROVIDER_SITE_OTHER): Payer: 59 | Admitting: Sports Medicine

## 2018-01-21 DIAGNOSIS — H8112 Benign paroxysmal vertigo, left ear: Secondary | ICD-10-CM

## 2018-01-21 DIAGNOSIS — L299 Pruritus, unspecified: Secondary | ICD-10-CM

## 2018-01-21 DIAGNOSIS — H8110 Benign paroxysmal vertigo, unspecified ear: Secondary | ICD-10-CM | POA: Insufficient documentation

## 2018-01-21 DIAGNOSIS — E119 Type 2 diabetes mellitus without complications: Secondary | ICD-10-CM

## 2018-01-21 LAB — POCT UA - MICROALBUMIN
Albumin/Creatinine Ratio, Urine, POC: <30
Creatinine, POC: 300 mg/dL
Microalbumin Ur, POC: 10 mg/L

## 2018-01-21 MED ORDER — LIDOCAINE 5 % EX OINT: 1.0000 "application " | TOPICAL_OINTMENT | Freq: Two times a day (BID) | CUTANEOUS | 11 refills | Status: DC | PRN

## 2018-01-21 NOTE — Patient Instructions (Signed)
Benign Positional Vertigo  Vertigo is the feeling that you or your surroundings are moving when they are not. Benign positional vertigo is the most common form of vertigo. This is usually a harmless condition (benign). This condition is positional. This means that symptoms are triggered by certain movements and positions.  This condition can be dangerous if it occurs while you are doing something that could cause harm to you or others. This includes activities such as driving or operating machinery.  What are the causes?  In many cases, the cause of this condition is not known. It may be caused by a disturbance in an area of the inner ear that helps your brain to sense movement and balance. This disturbance can be caused by:   Viral infection (labyrinthitis).   Head injury.   Repetitive motion, such as jumping, dancing, or running.  What increases the risk?  You are more likely to develop this condition if:   You are a woman.   You are 50 years of age or older.  What are the signs or symptoms?  Symptoms of this condition usually happen when you move your head or your eyes in different directions. Symptoms may start suddenly, and usually last for less than a minute. They include:   Loss of balance and falling.   Feeling like you are spinning or moving.   Feeling like your surroundings are spinning or moving.   Nausea and vomiting.   Blurred vision.   Dizziness.   Involuntary eye movement (nystagmus).  Symptoms can be mild and cause only minor problems, or they can be severe and interfere with daily life. Episodes of benign positional vertigo may return (recur) over time. Symptoms may improve over time.  How is this diagnosed?  This condition may be diagnosed based on:   Your medical history.   Physical exam of the head, neck, and ears.   Tests, such as:  ? MRI.  ? CT scan.  ? Eye movement tests. Your health care provider may ask you to change positions quickly while he or she watches you for symptoms  of benign positional vertigo, such as nystagmus. Eye movement may be tested with a variety of exams that are designed to evaluate or stimulate vertigo.  ? An electroencephalogram (EEG). This records electrical activity in your brain.  ? Hearing tests.  You may be referred to a health care provider who specializes in ear, nose, and throat (ENT) problems (otolaryngologist) or a provider who specializes in disorders of the nervous system (neurologist).  How is this treated?    This condition may be treated in a session in which your health care provider moves your head in specific positions to adjust your inner ear back to normal. Treatment for this condition may take several sessions. Surgery may be needed in severe cases, but this is rare.  In some cases, benign positional vertigo may resolve on its own in 2-4 weeks.  Follow these instructions at home:  Safety   Move slowly. Avoid sudden body or head movements or certain positions, as told by your health care provider.   Avoid driving until your health care provider says it is safe for you to do so.   Avoid operating heavy machinery until your health care provider says it is safe for you to do so.   Avoid doing any tasks that would be dangerous to you or others if vertigo occurs.   If you have trouble walking or keeping your balance, try using   a cane for stability. If you feel dizzy or unstable, sit down right away.   Return to your normal activities as told by your health care provider. Ask your health care provider what activities are safe for you.  General instructions   Take over-the-counter and prescription medicines only as told by your health care provider.   Drink enough fluid to keep your urine pale yellow.   Keep all follow-up visits as told by your health care provider. This is important.  Contact a health care provider if:   You have a fever.   Your condition gets worse or you develop new symptoms.   Your family or friends notice any  behavioral changes.   You have nausea or vomiting that gets worse.   You have numbness or a "pins and needles" sensation.  Get help right away if you:   Have difficulty speaking or moving.   Are always dizzy.   Faint.   Develop severe headaches.   Have weakness in your legs or arms.   Have changes in your hearing or vision.   Develop a stiff neck.   Develop sensitivity to light.  Summary   Vertigo is the feeling that you or your surroundings are moving when they are not. Benign positional vertigo is the most common form of vertigo.   The cause of this condition is not known. It may be caused by a disturbance in an area of the inner ear that helps your brain to sense movement and balance.   Symptoms include loss of balance and falling, feeling that you or your surroundings are moving, nausea and vomiting, and blurred vision.   This condition can be diagnosed based on symptoms, physical exam, and other tests, such as MRI, CT scan, eye movement tests, and hearing tests.   Follow safety instructions as told by your health care provider. You will also be told when to contact your health care provider in case of problems.  This information is not intended to replace advice given to you by your health care provider. Make sure you discuss any questions you have with your health care provider.  Document Released: 09/26/2005 Document Revised: 05/30/2017 Document Reviewed: 05/30/2017  Elsevier Interactive Patient Education  2019 Elsevier Inc.

## 2018-01-21 NOTE — Progress Notes (Signed)
Subjective:    CC: Several issues  HPI: Right foot itching: Localized just medial to the heel, no rash.  No paresthesias above or below the location of itching, no back pain.  No trauma, no constitutional symptoms, no swelling.  He has tried some topical steroid cream without any improvement.  Diabetes mellitus type 2: Diet controlled, due for urine microalbumin.  Dizziness: Has had a few episodes when laying in bed of rolling over to the left and then feeling the room spinning.  No visual changes, no changes in hearing, no other focal neurologic symptoms.  I reviewed the past medical history, family history, social history, surgical history, and allergies today and no changes were needed.  Please see the problem list section below in epic for further details.  Past Medical History: Past Medical History:  Diagnosis Date  . Tenonitis    of AC   Past Surgical History: Past Surgical History:  Procedure Laterality Date  . EYE SURGERY Right    Social History: Social History   Socioeconomic History  . Marital status: Married    Spouse name: Arbie Cookey  . Number of children: 2  . Years of education: Not on file  . Highest education level: Not on file  Occupational History  . 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   Social Needs  . Financial resource strain: Not on file  . Food insecurity:    Worry: Not on file    Inability: Not on file  . Transportation needs:    Medical: Not on file    Non-medical: Not on file  Tobacco Use  . Smoking status: Never Smoker  . Smokeless tobacco: Never Used  Substance and Sexual Activity  . Alcohol use: Yes    Alcohol/week: 0.0 standard drinks    Comment: rare  . Drug use: No  . Sexual activity: Not on file  Lifestyle  . Physical activity:    Days per week: Not on file    Minutes per session: Not on file  . Stress: Not on file  Relationships  . Social connections:    Talks on phone: Not on file    Gets together: Not on  file    Attends religious service: Not on file    Active member of club or organization: Not on file    Attends meetings of clubs or organizations: Not on file    Relationship status: Not on file  Other Topics Concern  . Not on file  Social History Narrative   Works out regularly every other day.  Some caffeine.     Family History: No family history on file. Allergies: No Known Allergies Medications: See med rec.  Review of Systems: No fevers, chills, night sweats, weight loss, chest pain, or shortness of breath.   Objective:    General: Well Developed, well nourished, and in no acute distress.  Neuro: Alert and oriented x3, extra-ocular muscles intact, sensation grossly intact.  Cranial nerves II through XII are intact, motor, sensory, coordinative functions are all intact. HEENT: Normocephalic, atraumatic, pupils equal round reactive to light, neck supple, no masses, no lymphadenopathy, thyroid nonpalpable.  Skin: Warm and dry, no rashes.  Specifically no rash at the site of reported pruritus. Cardiac: Regular rate and rhythm, no murmurs rubs or gallops, no lower extremity edema.  Respiratory: Clear to auscultation bilaterally. Not using accessory muscles, speaking in full sentences.  Impression and Recommendations:    Pruritus Unclear etiology. Benign exam, no rash. Specifically he denies  any paresthesias or back pain. Pruritus on the medial right heel. Adding topical lidocaine, he tried topical steroids without any improvement. If no resolution over the next month we will discuss biopsy.   Diabetes mellitus type 2, controlled, without complications Diet controlled. Urine microalbumin today.  Benign paroxysmal positional vertigo I would like a few sessions of vestibular physical therapy.  ___________________________________________ Gwen Her. Dianah Field, M.D., ABFM., CAQSM. Primary Care and Sports Medicine [facility]  Adjunct Professor of  [place] [employer] [facility] of Medicine

## 2018-01-21 NOTE — Assessment & Plan Note (Signed)
I would like a few sessions of vestibular physical therapy.

## 2018-01-21 NOTE — Assessment & Plan Note (Signed)
Diet controlled. Urine microalbumin today.

## 2018-01-21 NOTE — Assessment & Plan Note (Signed)
Unclear etiology. Benign exam, no rash. Specifically he denies any paresthesias or back pain. Pruritus on the medial right heel. Adding topical lidocaine, he tried topical steroids without any improvement. If no resolution over the next month we will discuss biopsy.

## 2018-01-24 ENCOUNTER — Ambulatory Visit: Payer: 59 | Admitting: Physical Therapy

## 2018-01-24 ENCOUNTER — Encounter: Payer: Self-pay | Admitting: Physical Therapy

## 2018-01-24 ENCOUNTER — Other Ambulatory Visit: Payer: Self-pay

## 2018-01-24 DIAGNOSIS — H8112 Benign paroxysmal vertigo, left ear: Secondary | ICD-10-CM | POA: Diagnosis not present

## 2018-01-24 NOTE — Patient Instructions (Signed)
Access Code: [email or web address]  URL: [email or web address]  Date: 01/24/2018  Prepared by: Faustino Congress   Exercises  Brandt-Daroff Vestibular Exercise - 3-5 reps - 1 sets - 2x daily - 7x weekly

## 2018-01-24 NOTE — Therapy (Addendum)
[place], [place], [address] Phone: [phone]   Fax:  [phone]  Physical Therapy Evaluation/Discharge  Patient Details  Name: Joel Perry MRN: [id number] Date of Birth: 1965/10/06 Referring Provider (PT): Silverio Decamp, MD   Encounter Date: 01/24/2018  PT End of Session - 01/24/18 1253    Visit Number  1    Number of Visits  6    Date for PT Re-Evaluation  03/07/18    PT Start Time  1211    PT Stop Time  1962    PT Time Calculation (min)  24 min    Activity Tolerance  Patient tolerated treatment well    Behavior During Therapy  [facility] for tasks assessed/performed       Past Medical History:  Diagnosis Date  . Tenonitis    of AC    Past Surgical History:  Procedure Laterality Date  . EYE SURGERY Right     There were no vitals filed for this visit.   Subjective Assessment - 01/24/18 1213    Subjective  Pt is a 53 y/o male who presents to OPPT for vertigo when lying on Lt side.  Pt states intermittent symptoms x 1 year.  Pt states symptoms are intermittent and do not happen every time with lying on Lt side.    Patient Stated Goals  stop being dizzy    Currently in Pain?  No/denies         [facility] PT Assessment - 01/24/18 1214      Assessment   Medical Diagnosis  BPPV Lt ear    Referring Provider (PT)  Silverio Decamp, MD    Onset Date/Surgical Date  --   ~ 1 year   Next MD Visit  Oct 2020    Prior Therapy  unrelated to vertigo      Precautions   Precautions  None      Restrictions   Weight Bearing Restrictions  No      Balance Screen   Has the patient fallen in the past 6 months  No    Has the patient had a decrease in activity level because of a fear of falling?   No    Is the patient reluctant to leave their home because of a fear of falling?   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    Leisure  regular exercise daily; coaches high school basketball      Cognition   Overall Cognitive Status  Within Functional Limits for tasks assessed           Vestibular Assessment - 01/24/18 1216      Vestibular Assessment   General Observation  no symptoms at rest      Symptom Behavior   Type of Dizziness  Spinning    Frequency of Dizziness  lying on Lt side, intermittent    Duration of Dizziness  not sure; moves out of position    Aggravating Factors  Rolling to left   sit to lying on Lt side   Relieving Factors  Comments   moving out of provoking position     Occulomotor Exam   Occulomotor Alignment  Normal    Spontaneous  Absent    Gaze-induced  Absent    Smooth Pursuits  Intact  Saccades  Intact      Vestibulo-Occular Reflex   VOR 1 Head Only (x 1 viewing)  WNL    Comment  HIT (-) bil      Positional Testing   Dix-Hallpike  Dix-Hallpike Right;Dix-Hallpike Left    Sidelying Test  Sidelying Left    Horizontal Canal Testing  Horizontal Canal Right;Horizontal Canal Left      Dix-Hallpike Right   Dix-Hallpike Right Duration  none    Dix-Hallpike Right Symptoms  No nystagmus      Dix-Hallpike Left   Dix-Hallpike Left Duration  none    Dix-Hallpike Left Symptoms  No nystagmus      Sidelying Left   Sidelying Left Duration  2-3 beats    Sidelying Left Symptoms  Upbeat, left rotatory nystagmus      Horizontal Canal Right   Horizontal Canal Right Duration  none    Horizontal Canal Right Symptoms  Normal      Horizontal Canal Left   Horizontal Canal Left Duration  none    Horizontal Canal Left Symptoms  Normal          Objective measurements completed on examination: See above findings.       Vestibular Treatment/Exercise - 01/24/18 1252      Vestibular Treatment/Exercise   Vestibular Treatment Provided  Canalith Repositioning    Canalith Repositioning  Epley Manuever  Left       EPLEY MANUEVER LEFT   Number of Reps   1    Overall Response   Symptoms Resolved            PT Education - 01/24/18 1253    Education Details  BPPV, Nestor Lewandowsky    Person(s) Educated  Patient    Methods  Explanation;Demonstration;Handout    Comprehension  Verbalized understanding;Need further instruction          PT Long Term Goals - 01/24/18 1255      PT LONG TERM GOAL #1   Title  to be determined if pt returns             Plan - 01/24/18 1254    Clinical Impression Statement  Pt is a 53 y/o male who presents to OPPT for vertigo, consistent with Lt pBPPV.  Difficult to provoke symptoms today, but 2-3 beats of nystagmus seen with Lt sidelying test, and treated with Lt epley's today.  Pt to call if vertigo returns, and if so will plan to write goals.    Clinical Presentation  Stable    Clinical Decision Making  Low    Rehab Potential  Excellent    PT Frequency  1x / week   PRN 1x/wk   PT Duration  6 weeks    PT Treatment/Interventions  Canalith Repostioning;Vestibular;Neuromuscular re-education;Balance training    PT Next Visit Plan  reassess and write goals if pt returns    PT Home Exercise Plan  Access Code: [email or web address]    Consulted and Agree with Plan of Care  Patient       Patient will benefit from skilled therapeutic intervention in order to improve the following deficits and impairments:  Dizziness  Visit Diagnosis: BPPV (benign paroxysmal positional vertigo), left - Plan: PT plan of care cert/re-cert     Problem List Patient Active Problem List   Diagnosis Date Noted  . Pruritus 01/21/2018  . Benign paroxysmal positional vertigo 01/21/2018  . Carpal tunnel syndrome, right 09/13/2016  . Tinea pedis 05/17/2016  . Obesity 05/04/2015  . Primary osteoarthritis of  right elbow 03/23/2015  . Diabetes mellitus type 2, controlled, without complications ([place]) 85/48/8301  . Hyperlipidemia LDL goal <100 04/15/2014  . Male hypogonadism  04/15/2014  . Annual physical exam 03/25/2014  . Varicose veins 07/03/2013  . Rotator cuff syndrome of right shoulder 11/08/2012  . Osteoarthritis of right knee 08/02/2012  . Low back pain 08/02/2012  . GERD 12/10/2008      Laureen Abrahams, PT, DPT 01/24/18 12:57 PM     [facility] [place], [place], [address] Phone: [phone]   Fax:  [phone]  Name: Joel Perry MRN: [id number] Date of Birth: [birth year].       PHYSICAL THERAPY DISCHARGE SUMMARY  Visits from Start of Care: 1  Current functional level related to goals / functional outcomes: See above   Remaining deficits: See above   Education / Equipment: HEP  Plan: Patient agrees to discharge.  Patient goals were not met. Patient is being discharged due to not returning since the last visit.  ?????    Laureen Abrahams, PT, DPT 06/17/18 1:35 PM    [facility] [place] [place], [place] [address]  [phone] (office) [phone] (fax)

## 2018-02-01 ENCOUNTER — Encounter: Payer: Self-pay | Admitting: Sports Medicine

## 2018-02-01 MED ORDER — TRIAMCINOLONE ACETONIDE 0.5 % EX OINT: 1.0000 "application " | TOPICAL_OINTMENT | Freq: Two times a day (BID) | CUTANEOUS | 3 refills | Status: DC

## 2018-06-14 ENCOUNTER — Encounter: Payer: Self-pay | Admitting: Physical Therapy

## 2018-06-17 ENCOUNTER — Encounter: Payer: Self-pay | Admitting: Sports Medicine

## 2018-06-17 MED ORDER — TRIAMCINOLONE ACETONIDE 0.5 % EX OINT: 1.0000 "application " | TOPICAL_OINTMENT | Freq: Two times a day (BID) | CUTANEOUS | 3 refills | Status: DC

## 2018-06-21 ENCOUNTER — Ambulatory Visit (INDEPENDENT_AMBULATORY_CARE_PROVIDER_SITE_OTHER): Payer: 59 | Admitting: Sports Medicine

## 2018-06-21 ENCOUNTER — Encounter: Payer: Self-pay | Admitting: Sports Medicine

## 2018-06-21 ENCOUNTER — Other Ambulatory Visit: Payer: Self-pay

## 2018-06-21 DIAGNOSIS — E291 Testicular hypofunction: Secondary | ICD-10-CM

## 2018-06-21 DIAGNOSIS — N529 Male erectile dysfunction, unspecified: Secondary | ICD-10-CM | POA: Insufficient documentation

## 2018-06-21 DIAGNOSIS — D224 Melanocytic nevi of scalp and neck: Secondary | ICD-10-CM | POA: Diagnosis not present

## 2018-06-21 MED ORDER — SILDENAFIL CITRATE 20 MG PO TABS: 20.0000 mg | ORAL_TABLET | ORAL | 11 refills | Status: DC | PRN

## 2018-06-21 NOTE — Progress Notes (Signed)
Subjective:    CC: Scalp lesion, low libido  HPI: Joel Perry is a very pleasant 53 year old male, recently he noticed a lesion of the scalp, for the most part asymptomatic.  In addition he complains of low libido.  He does have a history of hypergonadism with a testosterone level in the 200s, on further questioning he does endorse an adequate erectile quality, and this in turn results in decreased desire to have sex due to a fear for performance.  I reviewed the past medical history, family history, social history, surgical history, and allergies today and no changes were needed.  Please see the problem list section below in epic for further details.  Past Medical History: Past Medical History:  Diagnosis Date  . Tenonitis    of AC   Past Surgical History: Past Surgical History:  Procedure Laterality Date  . EYE SURGERY Right    Social History: Social History   Socioeconomic History  . Marital status: Married    Spouse name: Joel Perry  . Number of children: 2  . Years of education: Not on file  . Highest education level: Not on file  Occupational History  . 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   Social Needs  . Financial resource strain: Not on file  . Food insecurity    Worry: Not on file    Inability: Not on file  . Transportation needs    Medical: Not on file    Non-medical: Not on file  Tobacco Use  . Smoking status: Never Smoker  . Smokeless tobacco: Never Used  Substance and Sexual Activity  . Alcohol use: Yes    Alcohol/week: 0.0 standard drinks    Comment: rare  . Drug use: No  . Sexual activity: Not on file  Lifestyle  . Physical activity    Days per week: Not on file    Minutes per session: Not on file  . Stress: Not on file  Relationships  . Social Herbalist on phone: Not on file    Gets together: Not on file    Attends religious service: Not on file    Active member of club or organization: Not on file    Attends meetings  of clubs or organizations: Not on file    Relationship status: Not on file  Other Topics Concern  . Not on file  Social History Narrative   Works out regularly every other day.  Some caffeine.     Family History: No family history on file. Allergies: No Known Allergies Medications: See med rec.  Review of Systems: No fevers, chills, night sweats, weight loss, chest pain, or shortness of breath.   Objective:    General: Well Developed, well nourished, and in no acute distress.  Neuro: Alert and oriented x3, extra-ocular muscles intact, sensation grossly intact.  HEENT: Normocephalic, atraumatic, pupils equal round reactive to light, neck supple, no masses, no lymphadenopathy, thyroid nonpalpable.  Skin: Warm and dry, no rashes. Cardiac: Regular rate and rhythm, no murmurs rubs or gallops, no lower extremity edema.  Respiratory: Clear to auscultation bilaterally. Not using accessory muscles, speaking in full sentences. Scalp: There is a sebaceous nevus over the scalp.  Procedure:  Cryodestruction of scalp sebaceous nevus Consent obtained and verified. Time-out conducted. Noted no overlying erythema, induration, or other signs of local infection. Completed without difficulty using Cryo-Gun. Advised to call if fevers/chills, erythema, induration, drainage, or persistent bleeding.  Impression and Recommendations:    Male hypogonadism  History of low testosterone. He does have low libido, I think it is multifactorial and related more to degree of erectile dysfunction rather than primary hypoactive sexual desire. I am going to check his testosterone today but I am going to initiate treatment with a 5 phosphodiesterase inhibitor.  Erectile dysfunction Starting low-dose generic Viagra. Follow-up in 1 month.  Sebaceous nevus of scalp Cryotherapy as above.   ___________________________________________ Gwen Her. Dianah Field, M.D., ABFM., CAQSM. Primary Care and Sports Medicine  [facility]  Adjunct Professor of [place] [employer] [facility] of Medicine

## 2018-06-21 NOTE — Assessment & Plan Note (Signed)
History of low testosterone. He does have low libido, I think it is multifactorial and related more to degree of erectile dysfunction rather than primary hypoactive sexual desire. I am going to check his testosterone today but I am going to initiate treatment with a 5 phosphodiesterase inhibitor.

## 2018-06-21 NOTE — Assessment & Plan Note (Signed)
Cryotherapy as above. 

## 2018-06-21 NOTE — Assessment & Plan Note (Signed)
Starting low-dose generic Viagra. Follow-up in 1 month.

## 2018-06-24 LAB — TESTOSTERONE, FREE & TOTAL
Free Testosterone: 80.1 pg/mL (ref 35.0–155.0)
Testosterone, Total, LC-MS-MS: 413 ng/dL (ref 250–1100)

## 2018-06-25 ENCOUNTER — Encounter: Payer: Self-pay | Admitting: Sports Medicine

## 2018-07-19 ENCOUNTER — Ambulatory Visit: Payer: 59 | Admitting: Sports Medicine

## 2018-08-29 ENCOUNTER — Encounter: Payer: Self-pay | Admitting: Sports Medicine

## 2018-09-06 ENCOUNTER — Telehealth: Payer: Self-pay | Admitting: Sports Medicine

## 2018-09-06 DIAGNOSIS — E119 Type 2 diabetes mellitus without complications: Secondary | ICD-10-CM

## 2018-09-06 NOTE — Telephone Encounter (Signed)
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 Thanks

## 2018-09-06 NOTE — Telephone Encounter (Signed)
Patient notified and will come on Tuesday to have labs drawn,

## 2018-09-06 NOTE — Telephone Encounter (Signed)
Labs ordered.

## 2018-09-11 LAB — COMPLETE METABOLIC PANEL WITH GFR
AG Ratio: 1.5 (calc) (ref 1.0–2.5)
ALT: 31 U/L (ref 9–46)
AST: 24 U/L (ref 10–35)
Albumin: 4.4 g/dL (ref 3.6–5.1)
Alkaline phosphatase (APISO): 82 U/L (ref 35–144)
BUN: 14 mg/dL (ref 7–25)
CO2: 30 mmol/L (ref 20–32)
Calcium: 9.9 mg/dL (ref 8.6–10.3)
Chloride: 103 mmol/L (ref 98–110)
Creat: 1.07 mg/dL (ref 0.70–1.33)
GFR, Est African American: 93 mL/min/{1.73_m2} (ref >=60)
GFR, Est Non African American: 80 mL/min/{1.73_m2} (ref >=60)
Globulin: 2.9 g/dL (calc) (ref 1.9–3.7)
Glucose, Bld: 110 mg/dL (ref 65–139)
Potassium: 4.2 mmol/L (ref 3.5–5.3)
Sodium: 142 mmol/L (ref 135–146)
Total Bilirubin: 0.4 mg/dL (ref 0.2–1.2)
Total Protein: 7.3 g/dL (ref 6.1–8.1)

## 2018-09-11 LAB — HEMOGLOBIN A1C
Hgb A1c MFr Bld: 5.9 % of total Hgb — ABNORMAL HIGH (ref <5.7)
Mean Plasma Glucose: 123 (calc)
eAG (mmol/L): 6.8 (calc)

## 2018-09-11 LAB — CBC
HCT: 43.7 % (ref 38.5–50.0)
Hemoglobin: 14.2 g/dL (ref 13.2–17.1)
MCH: 26.2 pg — ABNORMAL LOW (ref 27.0–33.0)
MCHC: 32.5 g/dL (ref 32.0–36.0)
MCV: 80.5 fL (ref 80.0–100.0)
MPV: 11.6 fL (ref 7.5–12.5)
Platelets: 186 10*3/uL (ref 140–400)
RBC: 5.43 10*6/uL (ref 4.20–5.80)
RDW: 13.4 % (ref 11.0–15.0)
WBC: 4.6 10*3/uL (ref 3.8–10.8)

## 2018-09-11 LAB — LIPID PANEL W/REFLEX DIRECT LDL
Cholesterol: 190 mg/dL (ref <200)
HDL: 44 mg/dL (ref >=40)
LDL Cholesterol (Calc): 113 mg/dL (calc) — ABNORMAL HIGH
Non-HDL Cholesterol (Calc): 146 mg/dL (calc) — ABNORMAL HIGH (ref <130)
Total CHOL/HDL Ratio: 4.3 (calc) (ref <5.0)
Triglycerides: 212 mg/dL — ABNORMAL HIGH (ref <150)

## 2018-09-11 LAB — TSH: TSH: 1.45 mIU/L (ref 0.40–4.50)

## 2018-10-02 ENCOUNTER — Ambulatory Visit (INDEPENDENT_AMBULATORY_CARE_PROVIDER_SITE_OTHER): Payer: 59 | Admitting: Sports Medicine

## 2018-10-02 ENCOUNTER — Other Ambulatory Visit: Payer: Self-pay

## 2018-10-02 ENCOUNTER — Encounter: Payer: Self-pay | Admitting: Sports Medicine

## 2018-10-02 DIAGNOSIS — Z0000 Encounter for general adult medical examination without abnormal findings: Secondary | ICD-10-CM | POA: Diagnosis not present

## 2018-10-02 DIAGNOSIS — E291 Testicular hypofunction: Secondary | ICD-10-CM | POA: Diagnosis not present

## 2018-10-02 DIAGNOSIS — E119 Type 2 diabetes mellitus without complications: Secondary | ICD-10-CM

## 2018-10-02 DIAGNOSIS — M7989 Other specified soft tissue disorders: Secondary | ICD-10-CM | POA: Diagnosis not present

## 2018-10-02 MED ORDER — DOXYCYCLINE HYCLATE 100 MG PO TABS: 100.0000 mg | ORAL_TABLET | Freq: Two times a day (BID) | ORAL | 0 refills | Status: AC

## 2018-10-02 MED ORDER — PREDNISONE 50 MG PO TABS: ORAL_TABLET | ORAL | 0 refills | Status: DC

## 2018-10-02 NOTE — Assessment & Plan Note (Signed)
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 Adding doxycycline, prednisone, x-rays.

## 2018-10-02 NOTE — Assessment & Plan Note (Signed)
Up-to-date on screening measures, checking routine labs

## 2018-10-02 NOTE — Assessment & Plan Note (Signed)
CPE as above. Checking routine labs.

## 2018-10-02 NOTE — Progress Notes (Signed)
Subjective:    CC: Annual physical, finger pain  HPI:  Joel Perry is a pleasant 53 year old male, he has had couple weeks of pain in his index finger, this is after doing some metal grinding.  In addition he is here for his physical, pain is moderate, persistent, localized without radiation.  I reviewed the past medical history, family history, social history, surgical history, and allergies today and no changes were needed.  Please see the problem list section below in epic for further details.  Past Medical History: Past Medical History:  Diagnosis Date  . Tenonitis    of AC   Past Surgical History: Past Surgical History:  Procedure Laterality Date  . EYE SURGERY Right    Social History: Social History   Socioeconomic History  . Marital status: Married    Spouse name: Joel Perry  . Number of children: 2  . Years of education: Not on file  . Highest education level: Not on file  Occupational History  . 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   Social Needs  . Financial resource strain: Not on file  . Food insecurity    Worry: Not on file    Inability: Not on file  . Transportation needs    Medical: Not on file    Non-medical: Not on file  Tobacco Use  . Smoking status: Never Smoker  . Smokeless tobacco: Never Used  Substance and Sexual Activity  . Alcohol use: Yes    Alcohol/week: 0.0 standard drinks    Comment: rare  . Drug use: No  . Sexual activity: Not on file  Lifestyle  . Physical activity    Days per week: Not on file    Minutes per session: Not on file  . Stress: Not on file  Relationships  . Social Herbalist on phone: Not on file    Gets together: Not on file    Attends religious service: Not on file    Active member of club or organization: Not on file    Attends meetings of clubs or organizations: Not on file    Relationship status: Not on file  Other Topics Concern  . Not on file  Social History Narrative   Works out  regularly every other day.  Some caffeine.     Family History: No family history on file. Allergies: No Known Allergies Medications: See med rec.  Review of Systems: No headache, visual changes, nausea, vomiting, diarrhea, constipation, dizziness, abdominal pain, skin rash, fevers, chills, night sweats, swollen lymph nodes, weight loss, chest pain, body aches, joint swelling, muscle aches, shortness of breath, mood changes, visual or auditory hallucinations.  Objective:    General: Well Developed, well nourished, and in no acute distress.  Neuro: Alert and oriented x3, extra-ocular muscles intact, sensation grossly intact. Cranial nerves II through XII are intact, motor, sensory, and coordinative functions are all intact. HEENT: Normocephalic, atraumatic, pupils equal round reactive to light, neck supple, no masses, no lymphadenopathy, thyroid nonpalpable. Oropharynx, nasopharynx, external ear canals are unremarkable. Skin: Warm and dry, no rashes noted.  Cardiac: Regular rate and rhythm, no murmurs rubs or gallops.  Respiratory: Clear to auscultation bilaterally. Not using accessory muscles, speaking in full sentences.  Abdominal: Soft, nontender, nondistended, positive bowel sounds, no masses, no organomegaly.  Left index finger: Swelling at the pad of the finger.  No erythema, no drainage.  Good motion, good strength.  Impression and Recommendations:    The patient was counselled, risk  factors were discussed, anticipatory guidance given.  Swelling of left index finger Possible foreign body, he does work with metal shaving. Adding doxycycline, prednisone, x-rays.  Annual physical exam CPE as above. Checking routine labs.  Diabetes mellitus type 2, controlled, without complications Up-to-date on screening measures, checking routine labs   ___________________________________________ Joel Perry Her. Dianah Field, M.D., ABFM., CAQSM. Primary Care and Sports Medicine [place]  [facility]  Adjunct Professor of [place] of [facility] of Medicine

## 2018-10-03 ENCOUNTER — Ambulatory Visit: Payer: Self-pay | Admitting: Family Medicine

## 2018-10-14 ENCOUNTER — Encounter: Payer: 59 | Admitting: Sports Medicine

## 2018-10-15 ENCOUNTER — Encounter: Payer: Self-pay | Admitting: Sports Medicine

## 2018-10-20 LAB — COMPLETE METABOLIC PANEL WITH GFR
AG Ratio: 1.6 (calc) (ref 1.0–2.5)
ALT: 25 U/L (ref 9–46)
AST: 24 U/L (ref 10–35)
Albumin: 4.4 g/dL (ref 3.6–5.1)
Alkaline phosphatase (APISO): 58 U/L (ref 35–144)
BUN/Creatinine Ratio: 12 (calc) (ref 6–22)
BUN: 17 mg/dL (ref 7–25)
CO2: 28 mmol/L (ref 20–32)
Calcium: 9.7 mg/dL (ref 8.6–10.3)
Chloride: 102 mmol/L (ref 98–110)
Creat: 1.37 mg/dL — ABNORMAL HIGH (ref 0.70–1.33)
GFR, Est African American: 69 mL/min/{1.73_m2} (ref >=60)
GFR, Est Non African American: 59 mL/min/{1.73_m2} — ABNORMAL LOW (ref >=60)
Globulin: 2.7 g/dL (calc) (ref 1.9–3.7)
Glucose, Bld: 97 mg/dL (ref 65–99)
Potassium: 4.3 mmol/L (ref 3.5–5.3)
Sodium: 139 mmol/L (ref 135–146)
Total Bilirubin: 0.5 mg/dL (ref 0.2–1.2)
Total Protein: 7.1 g/dL (ref 6.1–8.1)

## 2018-10-20 LAB — VITAMIN D 25 HYDROXY (VIT D DEFICIENCY, FRACTURES): Vit D, 25-Hydroxy: 22 ng/mL — ABNORMAL LOW (ref 30–100)

## 2018-10-20 LAB — CBC
HCT: 45.1 % (ref 38.5–50.0)
Hemoglobin: 14.3 g/dL (ref 13.2–17.1)
MCH: 26 pg — ABNORMAL LOW (ref 27.0–33.0)
MCHC: 31.7 g/dL — ABNORMAL LOW (ref 32.0–36.0)
MCV: 82.1 fL (ref 80.0–100.0)
MPV: 12.2 fL (ref 7.5–12.5)
Platelets: 188 10*3/uL (ref 140–400)
RBC: 5.49 10*6/uL (ref 4.20–5.80)
RDW: 13.9 % (ref 11.0–15.0)
WBC: 5.1 10*3/uL (ref 3.8–10.8)

## 2018-10-20 LAB — HEMOGLOBIN A1C
Hgb A1c MFr Bld: 6 % of total Hgb — ABNORMAL HIGH (ref <5.7)
Mean Plasma Glucose: 126 (calc)
eAG (mmol/L): 7 (calc)

## 2018-10-20 LAB — TESTOSTERONE, FREE & TOTAL
Free Testosterone: 77.4 pg/mL (ref 35.0–155.0)
Testosterone, Total, LC-MS-MS: 403 ng/dL (ref 250–1100)

## 2018-10-20 LAB — LIPID PANEL W/REFLEX DIRECT LDL
Cholesterol: 199 mg/dL (ref <200)
HDL: 49 mg/dL (ref >=40)
LDL Cholesterol (Calc): 128 mg/dL (calc) — ABNORMAL HIGH
Non-HDL Cholesterol (Calc): 150 mg/dL (calc) — ABNORMAL HIGH (ref <130)
Total CHOL/HDL Ratio: 4.1 (calc) (ref <5.0)
Triglycerides: 117 mg/dL (ref <150)

## 2018-10-20 LAB — TSH: TSH: 1.95 mIU/L (ref 0.40–4.50)

## 2018-10-20 LAB — PSA, TOTAL AND FREE
PSA, % Free: 8 % (calc) — ABNORMAL LOW (ref >25)
PSA, Free: 0.2 ng/mL
PSA, Total: 2.5 ng/mL (ref <=4.0)

## 2018-10-20 MED ORDER — VITAMIN D (ERGOCALCIFEROL) 1.25 MG (50000 UNIT) PO CAPS: 50000.0000 [IU] | ORAL_CAPSULE | ORAL | 0 refills | Status: DC

## 2018-10-20 NOTE — Addendum Note (Signed)
Addended by: Silverio Decamp on: 10/20/2018 09:17 AM   Modules accepted: Orders

## 2018-11-04 ENCOUNTER — Encounter: Payer: Self-pay | Admitting: Sports Medicine

## 2018-11-08 ENCOUNTER — Other Ambulatory Visit: Payer: Self-pay

## 2018-11-08 ENCOUNTER — Encounter: Payer: Self-pay | Admitting: Sports Medicine

## 2018-11-08 ENCOUNTER — Ambulatory Visit (INDEPENDENT_AMBULATORY_CARE_PROVIDER_SITE_OTHER): Payer: 59 | Admitting: Sports Medicine

## 2018-11-08 DIAGNOSIS — N529 Male erectile dysfunction, unspecified: Secondary | ICD-10-CM | POA: Diagnosis not present

## 2018-11-08 DIAGNOSIS — L918 Other hypertrophic disorders of the skin: Secondary | ICD-10-CM

## 2018-11-08 DIAGNOSIS — N139 Obstructive and reflux uropathy, unspecified: Secondary | ICD-10-CM | POA: Diagnosis not present

## 2018-11-08 MED ORDER — TADALAFIL 5 MG PO TABS: 5.0000 mg | ORAL_TABLET | Freq: Every day | ORAL | 11 refills | Status: DC

## 2018-11-08 NOTE — Progress Notes (Signed)
Subjective:    CC: Follow-up  HPI: Weak urine stream: Present for the past few months, no dribbling, weak stream, significant increase in episodes of nocturia without a change in fluid intake.  Erectile dysfunction: Stable on sildenafil.  Skin tag: Desires excision today.  I reviewed the past medical history, family history, social history, surgical history, and allergies today and no changes were needed.  Please see the problem list section below in epic for further details.  Past Medical History: Past Medical History:  Diagnosis Date  . Tenonitis    of AC   Past Surgical History: Past Surgical History:  Procedure Laterality Date  . EYE SURGERY Right    Social History: Social History   Socioeconomic History  . Marital status: Married    Spouse name: Arbie Cookey  . Number of children: 2  . Years of education: Not on file  . Highest education level: Not on file  Occupational History  . 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   Social Needs  . Financial resource strain: Not on file  . Food insecurity    Worry: Not on file    Inability: Not on file  . Transportation needs    Medical: Not on file    Non-medical: Not on file  Tobacco Use  . Smoking status: Never Smoker  . Smokeless tobacco: Never Used  Substance and Sexual Activity  . Alcohol use: Yes    Alcohol/week: 0.0 standard drinks    Comment: rare  . Drug use: No  . Sexual activity: Not on file  Lifestyle  . Physical activity    Days per week: Not on file    Minutes per session: Not on file  . Stress: Not on file  Relationships  . Social Herbalist on phone: Not on file    Gets together: Not on file    Attends religious service: Not on file    Active member of club or organization: Not on file    Attends meetings of clubs or organizations: Not on file    Relationship status: Not on file  Other Topics Concern  . Not on file  Social History Narrative   Works out regularly every  other day.  Some caffeine.     Family History: No family history on file. Allergies: No Known Allergies Medications: See med rec.  Review of Systems: No fevers, chills, night sweats, weight loss, chest pain, or shortness of breath.   Objective:    General: Well Developed, well nourished, and in no acute distress.  Neuro: Alert and oriented x3, extra-ocular muscles intact, sensation grossly intact.  HEENT: Normocephalic, atraumatic, pupils equal round reactive to light, neck supple, no masses, no lymphadenopathy, thyroid nonpalpable.  Skin: Warm and dry, no rashes.  Small acrochordon on the left side of the neck Cardiac: Regular rate and rhythm, no murmurs rubs or gallops, no lower extremity edema.  Respiratory: Clear to auscultation bilaterally. Not using accessory muscles, speaking in full sentences.  Procedure:  Removal of left-sided neck skin tag(s). Risks, benefits, alternatives explained to patient. Consent obtained. Time out conducted. Noted no overlying induration or erythema at site of injection. Area was cleaned, I then cut the base of the skin tag with scissors, and subsequent electrocautery with a Hyfrecator used to control minor bleeding. Antibiotic ointment applied. Wound dressed. Advised to return if increased redness, swelling, drainage, fevers, or chills.  Impression and Recommendations:    Obstructive uropathy Adding 5 mg of Cialis,  this will help with erectile function as well.  Erectile dysfunction Discontinue sildenafil, switching to Cialis.  Skin tag Excision of skin tag on the neck.   ___________________________________________ Gwen Her. Dianah Field, M.D., ABFM., CAQSM. Primary Care and Sports Medicine [place] [facility]  Adjunct Professor of [place] of [facility] of Medicine

## 2018-11-08 NOTE — Assessment & Plan Note (Signed)
Discontinue sildenafil, switching to Cialis.

## 2018-11-08 NOTE — Assessment & Plan Note (Signed)
Excision of skin tag on the neck.

## 2018-11-08 NOTE — Assessment & Plan Note (Signed)
Adding 5 mg of Cialis, this will help with erectile function as well.

## 2018-12-10 ENCOUNTER — Other Ambulatory Visit: Payer: Self-pay

## 2018-12-10 ENCOUNTER — Ambulatory Visit (INDEPENDENT_AMBULATORY_CARE_PROVIDER_SITE_OTHER): Payer: 59 | Admitting: Sports Medicine

## 2018-12-10 ENCOUNTER — Ambulatory Visit (INDEPENDENT_AMBULATORY_CARE_PROVIDER_SITE_OTHER): Payer: 59

## 2018-12-10 ENCOUNTER — Encounter: Payer: Self-pay | Admitting: Sports Medicine

## 2018-12-10 DIAGNOSIS — M25512 Pain in left shoulder: Secondary | ICD-10-CM | POA: Diagnosis not present

## 2018-12-10 DIAGNOSIS — N529 Male erectile dysfunction, unspecified: Secondary | ICD-10-CM | POA: Diagnosis not present

## 2018-12-10 MED ORDER — TADALAFIL 10 MG PO TABS: 10.0000 mg | ORAL_TABLET | Freq: Every day | ORAL | 3 refills | Status: DC

## 2018-12-10 MED ORDER — MELOXICAM 15 MG PO TABS: ORAL_TABLET | ORAL | 3 refills | Status: DC

## 2018-12-10 NOTE — Assessment & Plan Note (Signed)
Not much better with Cialis 5, increasing to 10 mg daily with good Rx coupon to [employer]

## 2018-12-10 NOTE — Progress Notes (Signed)
Subjective:    CC: Follow-up  HPI: Erectile dysfunction: Not much better with Cialis 5.  Obstructive uropathy: Only slightly improved with Cialis 5.  Shoulder pain: Left-sided, localized over the tip of the acromioclavicular joint, worse with bench press.  Moderate, persistent.  I reviewed the past medical history, family history, social history, surgical history, and allergies today and no changes were needed.  Please see the problem list section below in epic for further details.  Past Medical History: Past Medical History:  Diagnosis Date  . Tenonitis    of AC   Past Surgical History: Past Surgical History:  Procedure Laterality Date  . EYE SURGERY Right    Social History: Social History   Socioeconomic History  . Marital status: Married    Spouse name: Arbie Cookey  . Number of children: 2  . Years of education: Not on file  . Highest education level: Not on file  Occupational History  . 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   Social Needs  . Financial resource strain: Not on file  . Food insecurity    Worry: Not on file    Inability: Not on file  . Transportation needs    Medical: Not on file    Non-medical: Not on file  Tobacco Use  . Smoking status: Never Smoker  . Smokeless tobacco: Never Used  Substance and Sexual Activity  . Alcohol use: Yes    Alcohol/week: 0.0 standard drinks    Comment: rare  . Drug use: No  . Sexual activity: Not on file  Lifestyle  . Physical activity    Days per week: Not on file    Minutes per session: Not on file  . Stress: Not on file  Relationships  . Social Herbalist on phone: Not on file    Gets together: Not on file    Attends religious service: Not on file    Active member of club or organization: Not on file    Attends meetings of clubs or organizations: Not on file    Relationship status: Not on file  Other Topics Concern  . Not on file  Social History Narrative   Works out regularly  every other day.  Some caffeine.     Family History: No family history on file. Allergies: No Known Allergies Medications: See med rec.  Review of Systems: No fevers, chills, night sweats, weight loss, chest pain, or shortness of breath.   Objective:    General: Well Developed, well nourished, and in no acute distress.  Neuro: Alert and oriented x3, extra-ocular muscles intact, sensation grossly intact.  HEENT: Normocephalic, atraumatic, pupils equal round reactive to light, neck supple, no masses, no lymphadenopathy, thyroid nonpalpable.  Skin: Warm and dry, no rashes. Cardiac: Regular rate and rhythm, no murmurs rubs or gallops, no lower extremity edema.  Respiratory: Clear to auscultation bilaterally. Not using accessory muscles, speaking in full sentences. Left shoulder: Inspection reveals no abnormalities, atrophy or asymmetry. Tender of the acromioclavicular joint with a positive crossarm sign. ROM is full in all planes. Rotator cuff strength normal throughout. No signs of impingement with negative Neer and Hawkin's tests, empty can. Speeds and Yergason's tests normal. No labral pathology noted with negative Obrien's, negative crank, negative clunk, and good stability. Normal scapular function observed. No painful arc and no drop arm sign. No apprehension sign  Impression and Recommendations:    Arthralgia of left acromioclavicular joint X-rays, meloxicam, avoid straight an incline bench, he  will do dips and decline bench only for now. Return to see me in 1 month, injection if no better.  Erectile dysfunction Not much better with Cialis 5, increasing to 10 mg daily with good Rx coupon to [employer]   ___________________________________________ Gwen Her. Dianah Field, M.D., ABFM., CAQSM. Primary Care and Sports Medicine [place] [facility]  Adjunct Professor of [place] of [facility] of Medicine

## 2018-12-10 NOTE — Assessment & Plan Note (Signed)
X-rays, meloxicam, avoid straight an incline bench, he will do dips and decline bench only for now. Return to see me in 1 month, injection if no better.

## 2019-01-07 ENCOUNTER — Ambulatory Visit (INDEPENDENT_AMBULATORY_CARE_PROVIDER_SITE_OTHER): Payer: 59 | Admitting: Sports Medicine

## 2019-01-07 ENCOUNTER — Other Ambulatory Visit: Payer: Self-pay

## 2019-01-07 ENCOUNTER — Encounter: Payer: Self-pay | Admitting: Sports Medicine

## 2019-01-07 DIAGNOSIS — N529 Male erectile dysfunction, unspecified: Secondary | ICD-10-CM

## 2019-01-07 DIAGNOSIS — M25512 Pain in left shoulder: Secondary | ICD-10-CM

## 2019-01-07 DIAGNOSIS — N139 Obstructive and reflux uropathy, unspecified: Secondary | ICD-10-CM | POA: Diagnosis not present

## 2019-01-07 MED ORDER — DICLOFENAC SODIUM 2 % EX SOLN: 2.0000 | Freq: Two times a day (BID) | CUTANEOUS | 0 refills | Status: DC

## 2019-01-07 NOTE — Assessment & Plan Note (Signed)
At the last visit we increased to 10 mg of Cialis and this seems to be working well.

## 2019-01-07 NOTE — Assessment & Plan Note (Signed)
I increased him to 10 mg of Cialis at the last visit with a good Rx coupon, erectile function has normalized.

## 2019-01-07 NOTE — Progress Notes (Signed)
    Procedures performed today:    None.  Independent interpretation of tests performed by another provider:   None.  Impression and Recommendations:    Arthralgia of left acromioclavicular joint Joel Perry has really not been doing a great job avoiding motions that set off his acromioclavicular pain. We did discuss avoiding straight an incline bench press and other exercises that mimic this. Pain is not at goal. He does not hurt enough for an injection, adding some topical pen said, he will let me know if he needs me to call him in some. Return as needed for this. Certainly injection and distal clavicular excision are options.  Obstructive uropathy At the last visit we increased to 10 mg of Cialis and this seems to be working well.  Erectile dysfunction I increased him to 10 mg of Cialis at the last visit with a good Rx coupon, erectile function has normalized.    ___________________________________________ Gwen Her. Dianah Field, M.D., ABFM., CAQSM. Primary Care and [place] Instructor of [place] of [facility] of Medicine

## 2019-01-07 NOTE — Assessment & Plan Note (Addendum)
Joel Perry has really not been doing a great job avoiding motions that set off his acromioclavicular pain. We did discuss avoiding straight an incline bench press and other exercises that mimic this. Pain is not at goal. He does not hurt enough for an injection, adding some topical pen said, he will let me know if he needs me to call him in some. Return as needed for this. Certainly injection and distal clavicular excision are options.

## 2019-07-30 ENCOUNTER — Other Ambulatory Visit: Payer: Self-pay | Admitting: Sports Medicine

## 2019-07-30 DIAGNOSIS — N529 Male erectile dysfunction, unspecified: Secondary | ICD-10-CM

## 2019-08-25 ENCOUNTER — Ambulatory Visit (INDEPENDENT_AMBULATORY_CARE_PROVIDER_SITE_OTHER): Payer: 59 | Admitting: Sports Medicine

## 2019-08-25 ENCOUNTER — Encounter: Payer: Self-pay | Admitting: Sports Medicine

## 2019-08-25 ENCOUNTER — Other Ambulatory Visit: Payer: Self-pay

## 2019-08-25 DIAGNOSIS — N139 Obstructive and reflux uropathy, unspecified: Secondary | ICD-10-CM | POA: Diagnosis not present

## 2019-08-25 DIAGNOSIS — Z0000 Encounter for general adult medical examination without abnormal findings: Secondary | ICD-10-CM

## 2019-08-25 DIAGNOSIS — N529 Male erectile dysfunction, unspecified: Secondary | ICD-10-CM | POA: Diagnosis not present

## 2019-08-25 DIAGNOSIS — E119 Type 2 diabetes mellitus without complications: Secondary | ICD-10-CM

## 2019-08-25 LAB — POCT GLYCOSYLATED HEMOGLOBIN (HGB A1C): Hemoglobin A1C: 5.6 % (ref 4.0–5.6)

## 2019-08-25 MED ORDER — TADALAFIL 5 MG PO TABS: 5.0000 mg | ORAL_TABLET | Freq: Every day | ORAL | 3 refills | Status: DC | PRN

## 2019-08-25 NOTE — Assessment & Plan Note (Addendum)
Rechecking hemoglobin A1c. Rechecking of the labs including lipids.   He will likely need a statin if LDL over 100. Adding diabetic nephropathy screening, foot exam normal today. Eye exam was done on February 16 and was normal with Dr. Domingo Cocking.

## 2019-08-25 NOTE — Assessment & Plan Note (Signed)
Discontinue sildenafil, switching to Cialis 5 daily, he knows he can go up to 20 mg daily for this.

## 2019-08-25 NOTE — Assessment & Plan Note (Signed)
Joel Perry has mild ED, he has been using sildenafil, he is also having some obstructive uropathy type symptoms, switching to tadalafil 5 mg daily with a good Rx coupon.

## 2019-08-25 NOTE — Progress Notes (Signed)
Subjective:    CC: Annual Physical Exam  HPI:  This patient is here for their annual physical  I reviewed the past medical history, family history, social history, surgical history, and allergies today and no changes were needed.  Please see the problem list section below in epic for further details.  Past Medical History: Past Medical History:  Diagnosis Date  . Tenonitis    of AC   Past Surgical History: Past Surgical History:  Procedure Laterality Date  . EYE SURGERY Right    Social History: Social History   Socioeconomic History  . Marital status: Married    Spouse name: Arbie Cookey  . Number of children: 2  . Years of education: Not on file  . Highest education level: Not on file  Occupational History  . 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   Tobacco Use  . Smoking status: Never Smoker  . Smokeless tobacco: Never Used  Substance and Sexual Activity  . Alcohol use: Yes    Alcohol/week: 0.0 standard drinks    Comment: rare  . Drug use: No  . Sexual activity: Not on file  Other Topics Concern  . Not on file  Social History Narrative   Works out regularly every other day.  Some caffeine.     Social Determinants of Health   Financial Resource Strain:   . Difficulty of Paying Living Expenses: Not on file  Food Insecurity:   . Worried About Charity fundraiser in the Last Year: Not on file  . Ran Out of Food in the Last Year: Not on file  Transportation Needs:   . Lack of Transportation (Medical): Not on file  . Lack of Transportation (Non-Medical): Not on file  Physical Activity:   . Days of Exercise per Week: Not on file  . Minutes of Exercise per Session: Not on file  Stress:   . Feeling of Stress : Not on file  Social Connections:   . Frequency of Communication with Friends and Family: Not on file  . Frequency of Social Gatherings with Friends and Family: Not on file  . Attends Religious Services: Not on file  . Active Member of Clubs or  Organizations: Not on file  . Attends Archivist Meetings: Not on file  . Marital Status: Not on file   Family History: No family history on file. Allergies: No Known Allergies Medications: See med rec.  Review of Systems: No headache, visual changes, nausea, vomiting, diarrhea, constipation, dizziness, abdominal pain, skin rash, fevers, chills, night sweats, swollen lymph nodes, weight loss, chest pain, body aches, joint swelling, muscle aches, shortness of breath, mood changes, visual or auditory hallucinations.  Objective:    General: Well Developed, well nourished, and in no acute distress.  Neuro: Alert and oriented x3, extra-ocular muscles intact, sensation grossly intact. Cranial nerves II through XII are intact, motor, sensory, and coordinative functions are all intact. HEENT: Normocephalic, atraumatic, pupils equal round reactive to light, neck supple, no masses, no lymphadenopathy, thyroid nonpalpable. Oropharynx, nasopharynx, external ear canals are unremarkable. Skin: Warm and dry, no rashes noted.  Cardiac: Regular rate and rhythm, no murmurs rubs or gallops.  Respiratory: Clear to auscultation bilaterally. Not using accessory muscles, speaking in full sentences.  Abdominal: Soft, nontender, nondistended, positive bowel sounds, no masses, no organomegaly.  Musculoskeletal: Shoulder, elbow, wrist, hip, knee, ankle stable, and with full range of motion.  Impression and Recommendations:    The patient was counselled, risk factors were discussed,  anticipatory guidance given.  Annual physical exam Routine physical as above.  Diabetes mellitus type 2, controlled, without complications Rechecking hemoglobin A1c. Rechecking of the labs including lipids.   He will likely need a statin if LDL over 100. Adding diabetic nephropathy screening, foot exam normal today. Eye exam was done on February 16 and was normal with Dr. Domingo Cocking.  Erectile dysfunction Brennon has mild  ED, he has been using sildenafil, he is also having some obstructive uropathy type symptoms, switching to tadalafil 5 mg daily with a good Rx coupon.  Obstructive uropathy Discontinue sildenafil, switching to Cialis 5 daily, he knows he can go up to 20 mg daily for this.   ___________________________________________ Gwen Her. Dianah Field, M.D., ABFM., CAQSM. Primary Care and Sports Medicine [place] [facility]  Adjunct Professor of [place] of [facility] of Medicine

## 2019-08-25 NOTE — Assessment & Plan Note (Signed)
Routine physical as above. 

## 2019-08-26 LAB — CBC
HCT: 47.7 % (ref 38.5–50.0)
Hemoglobin: 15.2 g/dL (ref 13.2–17.1)
MCH: 26.2 pg — ABNORMAL LOW (ref 27.0–33.0)
MCHC: 31.9 g/dL — ABNORMAL LOW (ref 32.0–36.0)
MCV: 82.1 fL (ref 80.0–100.0)
MPV: 11.3 fL (ref 7.5–12.5)
Platelets: 178 10*3/uL (ref 140–400)
RBC: 5.81 10*6/uL — ABNORMAL HIGH (ref 4.20–5.80)
RDW: 13.2 % (ref 11.0–15.0)
WBC: 5 10*3/uL (ref 3.8–10.8)

## 2019-08-26 LAB — COMPLETE METABOLIC PANEL WITH GFR
AG Ratio: 1.5 (calc) (ref 1.0–2.5)
ALT: 24 U/L (ref 9–46)
AST: 23 U/L (ref 10–35)
Albumin: 4.6 g/dL (ref 3.6–5.1)
Alkaline phosphatase (APISO): 67 U/L (ref 35–144)
BUN: 15 mg/dL (ref 7–25)
CO2: 29 mmol/L (ref 20–32)
Calcium: 10 mg/dL (ref 8.6–10.3)
Chloride: 102 mmol/L (ref 98–110)
Creat: 1.18 mg/dL (ref 0.70–1.33)
GFR, Est African American: 82 mL/min/{1.73_m2} (ref >=60)
GFR, Est Non African American: 71 mL/min/{1.73_m2} (ref >=60)
Globulin: 3 g/dL (calc) (ref 1.9–3.7)
Glucose, Bld: 89 mg/dL (ref 65–99)
Potassium: 4.6 mmol/L (ref 3.5–5.3)
Sodium: 138 mmol/L (ref 135–146)
Total Bilirubin: 0.6 mg/dL (ref 0.2–1.2)
Total Protein: 7.6 g/dL (ref 6.1–8.1)

## 2019-08-26 LAB — PSA, TOTAL AND FREE
PSA, % Free: 11 % (calc) — ABNORMAL LOW (ref >25)
PSA, Free: 0.2 ng/mL
PSA, Total: 1.9 ng/mL (ref <=4.0)

## 2019-08-26 LAB — LIPID PANEL W/REFLEX DIRECT LDL
Cholesterol: 203 mg/dL — ABNORMAL HIGH (ref <200)
HDL: 55 mg/dL (ref >=40)
LDL Cholesterol (Calc): 130 mg/dL (calc) — ABNORMAL HIGH
Non-HDL Cholesterol (Calc): 148 mg/dL (calc) — ABNORMAL HIGH (ref <130)
Total CHOL/HDL Ratio: 3.7 (calc) (ref <5.0)
Triglycerides: 79 mg/dL (ref <150)

## 2019-08-26 LAB — TSH: TSH: 1.84 mIU/L (ref 0.40–4.50)

## 2019-08-26 LAB — MICROALBUMIN / CREATININE URINE RATIO
Creatinine, Urine: 83 mg/dL (ref 20–320)
Microalb, Ur: <0.2 mg/dL

## 2019-12-07 ENCOUNTER — Other Ambulatory Visit: Payer: Self-pay | Admitting: Sports Medicine

## 2019-12-07 DIAGNOSIS — N529 Male erectile dysfunction, unspecified: Secondary | ICD-10-CM

## 2020-02-03 DIAGNOSIS — N529 Male erectile dysfunction, unspecified: Secondary | ICD-10-CM

## 2020-02-03 MED ORDER — TADALAFIL 5 MG PO TABS: 5.0000 mg | ORAL_TABLET | Freq: Every day | ORAL | 3 refills | Status: DC | PRN

## 2020-02-03 NOTE — Addendum Note (Signed)
Addended by: Silverio Decamp on: 02/03/2020 03:54 PM   Modules accepted: Orders

## 2020-02-26 LAB — HM DIABETES EYE EXAM

## 2020-07-27 ENCOUNTER — Other Ambulatory Visit: Payer: Self-pay

## 2020-07-27 ENCOUNTER — Ambulatory Visit: Payer: BC Managed Care – PPO | Admitting: Family Medicine

## 2020-07-27 ENCOUNTER — Encounter: Payer: Self-pay | Admitting: Family Medicine

## 2020-07-27 VITALS — BP 107/71 | HR 69 | Resp 17

## 2020-07-27 DIAGNOSIS — L309 Dermatitis, unspecified: Secondary | ICD-10-CM | POA: Diagnosis not present

## 2020-07-27 MED ORDER — TRIAMCINOLONE ACETONIDE 0.5 % EX CREA: 1.0000 "application " | TOPICAL_CREAM | Freq: Two times a day (BID) | CUTANEOUS | 3 refills | Status: AC

## 2020-07-27 NOTE — Progress Notes (Signed)
Acute Office Visit  Subjective:    Patient ID: Joel Perry, male    DOB: Jan 10, 1965, 55 y.o.   MRN: [id number]  Chief Complaint  Patient presents with   Rash    HPI Patient is in today for rash.  Patient reports he has had trouble with eczema off and on for many years, however the past few weeks he has had a flare that he is having trouble getting under control. He is reporting dry, flaky, itchy rash to bilateral wrists, forearms, elbows. He takes a daily allergy medication and has had to take occasional benadryl for the itching, but doesn't like it because it makes him very drowsy. He has also tried Gold [employer] lotion which hasn't seemed to help much. He did have some left over triamcinolone cream at home which he tried a couple times, but he didn't notice much improvement - it is old and likely expired.  He denies any pain, burning, drainage, open lesions, fevers, myalgias/arthralgias, travel, changes to soaps/detergents, insect bites.      Past Medical History:  Diagnosis Date   Tenonitis    of AC    Past Surgical History:  Procedure Laterality Date   EYE SURGERY Right     History reviewed. No pertinent family history.  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   Tobacco Use   Smoking status: Never   Smokeless tobacco: Never  Substance and Sexual Activity   Alcohol use: Yes    Alcohol/week: 0.0 standard drinks    Comment: rare   Drug use: No   Sexual activity: Not on file  Other Topics Concern   Not on file  Social History Narrative   Works out regularly every other day.  Some caffeine.     Social Determinants of Health   Financial Resource Strain: Not on file  Food Insecurity: Not on file  Transportation Needs: Not on file   Physical Activity: Not on file  Stress: Not on file  Social Connections: Not on file  Intimate Partner Violence: Not on file    Outpatient Medications Prior to Visit  Medication Sig Dispense Refill   Diclofenac Sodium 2 % SOLN Place 2 sprays onto the skin 2 (two) times daily. 6 g 0   tadalafil (CIALIS) 5 MG tablet Take 1 tablet (5 mg total) by mouth daily as needed. 90 tablet 3   No facility-administered medications prior to visit.    No Known Allergies  Review of Systems All review of systems negative except what is listed in the HPI     Objective:    Physical Exam Vitals reviewed.  Constitutional:      Appearance: Normal appearance. He is normal weight.  Musculoskeletal:        General: Normal range of motion.  Skin:    Findings: Rash present. No erythema.     Comments: Several patches of dry skin to bilateral hands/wrists, forearms, no open lesions, erythema, edema, warmth  Neurological:     Mental Status: He is alert and oriented to person, place, and time.  Psychiatric:        Mood and Affect: Mood normal.        Behavior: Behavior normal.  Thought Content: Thought content normal.        Judgment: Judgment normal.    BP 107/71   Pulse 69   Resp 17   SpO2 98%  Wt Readings from Last 3 Encounters:  08/25/19 212 lb (96.2 kg)  12/10/18 214 lb (97.1 kg)  11/08/18 212 lb (96.2 kg)    Health Maintenance Due  Topic Date Due   Zoster Vaccines- Shingrix (1 of 2) Never done   Pneumococcal Vaccine 54-96 Years old (2 - PCV) 11/04/2015   COVID-19 Vaccine (3 - Pfizer risk series) 04/28/2019   OPHTHALMOLOGY EXAM  02/18/2020   HEMOGLOBIN A1C  02/25/2020   URINE MICROALBUMIN  08/24/2020    There are no preventive care reminders to display for this patient.   Lab Results  Component Value Date   TSH 1.84 08/25/2019   Lab Results  Component Value Date   WBC 5.0 08/25/2019   HGB 15.2 08/25/2019   HCT 47.7 08/25/2019   MCV 82.1 08/25/2019   PLT 178  08/25/2019   Lab Results  Component Value Date   NA 138 08/25/2019   K 4.6 08/25/2019   CO2 29 08/25/2019   GLUCOSE 89 08/25/2019   BUN 15 08/25/2019   CREATININE 1.18 08/25/2019   BILITOT 0.6 08/25/2019   ALKPHOS 65 05/23/2016   AST 23 08/25/2019   ALT 24 08/25/2019   PROT 7.6 08/25/2019   ALBUMIN 4.5 05/23/2016   CALCIUM 10.0 08/25/2019   Lab Results  Component Value Date   CHOL 203 (H) 08/25/2019   Lab Results  Component Value Date   HDL 55 08/25/2019   Lab Results  Component Value Date   LDLCALC 130 (H) 08/25/2019   Lab Results  Component Value Date   TRIG 79 08/25/2019   Lab Results  Component Value Date   CHOLHDL 3.7 08/25/2019   Lab Results  Component Value Date   HGBA1C 5.6 08/25/2019       Assessment & Plan:   1. Eczema, unspecified type Recommend we start treating this as eczema since he does report a history of eczema and allergies. If not improving after a 4-6 week trial of this regimen, can change plan - Use a mild, unscented soap (Dove Sensitive Skin, Aveeno, Vanicream products). - Bathe every other day if possible. Use mildly warm water. Lightly pat dry, do not dry completely or rub skin. - After the bath apply fragrance-free moisturizer (Vaseline, Aveeno Eczema Therapy, Aquaphor, Eucerin, Vanicream, CeraVe cream, Cetaphil Restoraderm, [employer]). These moisturizers can be applied multiple times per day to keep the skin from looking dry.  -Use gentle, fragrance-free laundry detergents  - Use the steroid ointment as indicated for flares. Apply the steroid creams to the skin first, then after a few minutes, top with moisturizer from list above.   - triamcinolone cream (KENALOG) 0.5 %; Apply 1 application topically 2 (two) times daily. To affected areas.  Dispense: 30 g; Refill: 3   Patient aware of signs/symptoms requiring further/urgent evaluation.  Follow-up in 4-6 weeks or sooner if needed.   [facility] Olevia Bowens, DNP, FNP-C

## 2020-07-27 NOTE — Patient Instructions (Signed)
Eczema - Use a mild, unscented soap (Dove Sensitive Skin, Aveeno, Vanicream products). - Bathe every other day if possible. Use mildly warm water. Lightly pat dry, do not dry completely or rub skin. - After the bath apply fragrance-free moisturizer (Vaseline, Aveeno Eczema Therapy, Aquaphor, Eucerin, Vanicream, CeraVe cream, Cetaphil Restoraderm, [employer]). These moisturizers can be applied multiple times per day to keep the skin from looking dry.  -Use gentle, fragrance-free laundry detergents  - Use the steroid ointment as indicated for flares. Apply the steroid creams to the skin first, then after a few minutes, top with moisturizer from list above.

## 2020-08-30 ENCOUNTER — Other Ambulatory Visit: Payer: Self-pay

## 2020-08-30 ENCOUNTER — Ambulatory Visit (INDEPENDENT_AMBULATORY_CARE_PROVIDER_SITE_OTHER): Payer: BC Managed Care – PPO | Admitting: Sports Medicine

## 2020-08-30 ENCOUNTER — Encounter: Payer: Self-pay | Admitting: Sports Medicine

## 2020-08-30 VITALS — BP 110/74 | HR 67 | Ht 69.0 in | Wt 212.0 lb

## 2020-08-30 DIAGNOSIS — E785 Hyperlipidemia, unspecified: Secondary | ICD-10-CM | POA: Diagnosis not present

## 2020-08-30 DIAGNOSIS — Z0000 Encounter for general adult medical examination without abnormal findings: Secondary | ICD-10-CM | POA: Diagnosis not present

## 2020-08-30 DIAGNOSIS — E119 Type 2 diabetes mellitus without complications: Secondary | ICD-10-CM

## 2020-08-30 DIAGNOSIS — N139 Obstructive and reflux uropathy, unspecified: Secondary | ICD-10-CM | POA: Diagnosis not present

## 2020-08-30 LAB — POCT GLYCOSYLATED HEMOGLOBIN (HGB A1C): HbA1c, POC (controlled diabetic range): 5.6 % (ref 0.0–7.0)

## 2020-08-30 NOTE — Assessment & Plan Note (Signed)
Annual physical as above. Joel Perry is due for some screening. He is also due for Shingrix but tells me he never had chickenpox, we will go ahead and check his varicella-zoster titers and if positive we will bring him back for Shingrix.

## 2020-08-30 NOTE — Progress Notes (Addendum)
Subjective:    CC: Annual Physical Exam  HPI:  This patient is here for their annual physical  I reviewed the past medical history, family history, social history, surgical history, and allergies today and no changes were needed.  Please see the problem list section below in epic for further details.  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   Tobacco Use   Smoking status: Never   Smokeless tobacco: Never  Substance and Sexual Activity   Alcohol use: Yes    Alcohol/week: 0.0 standard drinks    Comment: rare   Drug use: No   Sexual activity: Not on file  Other Topics Concern   Not on file  Social History Narrative   Works out regularly every other day.  Some caffeine.     Social Determinants of Health   Financial Resource Strain: Not on file  Food Insecurity: Not on file  Transportation Needs: Not on file  Physical Activity: Not on file  Stress: Not on file  Social Connections: Not on file   Family History: No family history on file. Allergies: No Known Allergies Medications: See med rec.  Review of Systems: No headache, visual changes, nausea, vomiting, diarrhea, constipation, dizziness, abdominal pain, skin rash, fevers, chills, night sweats, swollen lymph nodes, weight loss, chest pain, body aches, joint swelling, muscle aches, shortness of breath, mood changes, visual or auditory hallucinations.  Objective:    General: Well Developed, well nourished, and in no acute distress.  Neuro: Alert and oriented x3, extra-ocular muscles intact,  sensation grossly intact. Cranial nerves II through XII are intact, motor, sensory, and coordinative functions are all intact. HEENT: Normocephalic, atraumatic, pupils equal round reactive to light, neck supple, no masses, no lymphadenopathy, thyroid nonpalpable. Oropharynx, nasopharynx, external ear canals are unremarkable. Skin: Warm and dry, no rashes noted.  There were a few scattered seborrheic keratoses on the scalp. Cardiac: Regular rate and rhythm, no murmurs rubs or gallops.  Respiratory: Clear to auscultation bilaterally. Not using accessory muscles, speaking in full sentences.  Abdominal: Soft, nontender, nondistended, positive bowel sounds, no masses, no organomegaly.  Musculoskeletal: Shoulder, elbow, wrist, hip, knee, ankle stable, and with full range of motion.  Impression and Recommendations:    The patient was counselled, risk factors were discussed, anticipatory guidance given.  Annual physical exam Annual physical as above. Joel Perry is due for some screening. He is also due for Shingrix but tells me he never had chickenpox, we will go ahead and check his varicella-zoster titers and if positive we will bring him back for Shingrix.  Diabetes mellitus type 2, controlled, without complications Well-controlled diabetes mellitus type 2, he had a diabetic eye exam in February. We will get his nephropathy screening, A1c is well controlled today.  Hyperlipidemia LDL goal <100 Hyperlipidemia with a goal less than 100 LDL, as he is a diabetic, adding low-dose rosuvastatin.   ___________________________________________ Gwen Her. Dianah Field, M.D., ABFM., CAQSM. Primary Care and Sports Medicine [facility]  Adjunct Professor of [facility]  [employer]

## 2020-08-30 NOTE — Assessment & Plan Note (Signed)
Well-controlled diabetes mellitus type 2, he had a diabetic eye exam in February. We will get his nephropathy screening, A1c is well controlled today.

## 2020-08-31 LAB — CBC
HCT: 48.8 % (ref 38.5–50.0)
Hemoglobin: 15.6 g/dL (ref 13.2–17.1)
MCH: 26.3 pg — ABNORMAL LOW (ref 27.0–33.0)
MCHC: 32 g/dL (ref 32.0–36.0)
MCV: 82.3 fL (ref 80.0–100.0)
MPV: 10.6 fL (ref 7.5–12.5)
Platelets: 187 10*3/uL (ref 140–400)
RBC: 5.93 10*6/uL — ABNORMAL HIGH (ref 4.20–5.80)
RDW: 13.9 % (ref 11.0–15.0)
WBC: 3.9 10*3/uL (ref 3.8–10.8)

## 2020-08-31 LAB — VARICELLA ZOSTER ANTIBODY, IGG: Varicella IgG: 514.9 {index}

## 2020-08-31 LAB — COMPREHENSIVE METABOLIC PANEL WITH GFR
AG Ratio: 1.5 (calc) (ref 1.0–2.5)
ALT: 36 U/L (ref 9–46)
AST: 24 U/L (ref 10–35)
Albumin: 4.5 g/dL (ref 3.6–5.1)
Alkaline phosphatase (APISO): 80 U/L (ref 35–144)
BUN: 16 mg/dL (ref 7–25)
CO2: 32 mmol/L (ref 20–32)
Calcium: 10 mg/dL (ref 8.6–10.3)
Chloride: 102 mmol/L (ref 98–110)
Creat: 1.06 mg/dL (ref 0.70–1.30)
Globulin: 3 g/dL (ref 1.9–3.7)
Glucose, Bld: 97 mg/dL (ref 65–99)
Potassium: 4.7 mmol/L (ref 3.5–5.3)
Sodium: 139 mmol/L (ref 135–146)
Total Bilirubin: 0.6 mg/dL (ref 0.2–1.2)
Total Protein: 7.5 g/dL (ref 6.1–8.1)

## 2020-08-31 LAB — LIPID PANEL
Cholesterol: 213 mg/dL — ABNORMAL HIGH (ref <200)
HDL: 54 mg/dL (ref >=40)
LDL Cholesterol (Calc): 135 mg/dL (calc) — ABNORMAL HIGH
Non-HDL Cholesterol (Calc): 159 mg/dL (calc) — ABNORMAL HIGH (ref <130)
Total CHOL/HDL Ratio: 3.9 (calc) (ref <5.0)
Triglycerides: 127 mg/dL (ref <150)

## 2020-08-31 LAB — MICROALBUMIN / CREATININE URINE RATIO
Creatinine, Urine: 207 mg/dL (ref 20–320)
Microalb Creat Ratio: 2 mcg/mg creat (ref <30)
Microalb, Ur: 0.4 mg/dL

## 2020-08-31 LAB — PSA, TOTAL AND FREE
PSA, % Free: 10 % (calc) — ABNORMAL LOW (ref >25)
PSA, Free: 0.2 ng/mL
PSA, Total: 2.1 ng/mL (ref <=4.0)

## 2020-08-31 LAB — TSH: TSH: 2.3 mIU/L (ref 0.40–4.50)

## 2020-08-31 MED ORDER — ROSUVASTATIN CALCIUM 5 MG PO TABS: 5.0000 mg | ORAL_TABLET | Freq: Every day | ORAL | 3 refills | Status: DC

## 2020-08-31 NOTE — Addendum Note (Signed)
Addended by: Silverio Decamp on: 08/31/2020 05:02 PM   Modules accepted: Orders

## 2020-08-31 NOTE — Assessment & Plan Note (Signed)
Hyperlipidemia with a goal less than 100 LDL, as he is a diabetic, adding low-dose rosuvastatin.

## 2020-09-03 NOTE — Telephone Encounter (Signed)
I cannot just call something in for the libido, this is a discussion he and I will need to have, lets do a virtual visit of some kind or in person.

## 2020-10-01 ENCOUNTER — Other Ambulatory Visit: Payer: Self-pay

## 2020-10-01 ENCOUNTER — Ambulatory Visit (INDEPENDENT_AMBULATORY_CARE_PROVIDER_SITE_OTHER): Payer: BC Managed Care – PPO | Admitting: Sports Medicine

## 2020-10-01 VITALS — BP 119/76 | HR 70

## 2020-10-01 DIAGNOSIS — Z23 Encounter for immunization: Secondary | ICD-10-CM | POA: Diagnosis not present

## 2020-10-01 NOTE — Progress Notes (Signed)
Patient is here for a flu and Shingrix vaccine. Verified no previous allergy to flu vaccine, eggs, or latex. Flu injection to right deltoid with no apparent complications. Shingrix in injection to left deltoid with no apparent complications. Patient advised to call with any problems. He will follow up for second Shingrix vaccine in 2-6 months.

## 2020-12-01 ENCOUNTER — Ambulatory Visit: Payer: BC Managed Care – PPO

## 2020-12-09 ENCOUNTER — Other Ambulatory Visit: Payer: Self-pay

## 2020-12-09 ENCOUNTER — Ambulatory Visit (INDEPENDENT_AMBULATORY_CARE_PROVIDER_SITE_OTHER): Payer: BC Managed Care – PPO | Admitting: Sports Medicine

## 2020-12-09 VITALS — Temp 98.6°F

## 2020-12-09 DIAGNOSIS — Z23 Encounter for immunization: Secondary | ICD-10-CM

## 2020-12-09 NOTE — Progress Notes (Signed)
Established Patient Office Visit  Subjective:  Patient ID: Joel Perry, male    DOB: 1965/12/14  Age: 55 y.o. MRN: [id number]  CC:  Chief Complaint  Patient presents with   Immunizations    HPI SEVON ROTERT presents for his second Shingrix vaccine. Shingrix given in left deltoid without complications. Pt tolerated well.    Past Medical History:  Diagnosis Date   Tenonitis    of AC    Past Surgical History:  Procedure Laterality Date   EYE SURGERY Right     No family history on file.  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   Tobacco Use   Smoking status: Never   Smokeless tobacco: Never  Substance and Sexual Activity   Alcohol use: Yes    Alcohol/week: 0.0 standard drinks    Comment: rare   Drug use: No   Sexual activity: Not on file  Other Topics Concern   Not on file  Social History Narrative   Works out regularly every other day.  Some caffeine.     Social Determinants of Health   Financial Resource Strain: Not on file  Food Insecurity: Not on file  Transportation Needs: Not on file  Physical Activity: Not on file  Stress: Not on file  Social Connections: Not on file  Intimate Partner Violence: Not on file    Outpatient Medications Prior to Visit  Medication Sig Dispense Refill   Diclofenac Sodium 2 % SOLN Place 2 sprays onto the skin 2 (two) times daily. 6 g 0   rosuvastatin (CRESTOR) 5 MG tablet Take 1 tablet (5 mg total) by mouth daily at 6 PM. 90 tablet 3   tadalafil (CIALIS) 5 MG tablet Take 1 tablet (5 mg total) by mouth daily as needed. 90 tablet 3   triamcinolone cream (KENALOG) 0.5 % Apply 1 application topically 2 (two) times daily. To affected areas. 30 g 3   No facility-administered medications prior  to visit.    No Known Allergies  ROS Review of Systems    Objective:    Physical Exam  Temp 98.6 F (37 C)  Wt Readings from Last 3 Encounters:  08/30/20 212 lb (96.2 kg)  08/25/19 212 lb (96.2 kg)  12/10/18 214 lb (97.1 kg)     Health Maintenance Due  Topic Date Due   Pneumococcal Vaccine 73-105 Years old (2 - PCV) 11/04/2015   Fecal DNA (Cologuard)  11/01/2020   Zoster Vaccines- Shingrix (2 of 2) 11/26/2020    There are no preventive care reminders to display for this patient.  Lab Results  Component Value Date   TSH 2.30 08/30/2020   Lab Results  Component Value Date   WBC 3.9 08/30/2020   HGB 15.6 08/30/2020   HCT 48.8 08/30/2020   MCV 82.3 08/30/2020   PLT 187 08/30/2020   Lab Results  Component Value Date   NA 139 08/30/2020   K 4.7 08/30/2020   CO2 32 08/30/2020   GLUCOSE 97 08/30/2020   BUN 16 08/30/2020   CREATININE 1.06 08/30/2020   BILITOT 0.6 08/30/2020   ALKPHOS 65 05/23/2016   AST 24 08/30/2020   ALT 36 08/30/2020   PROT 7.5 08/30/2020   ALBUMIN 4.5  05/23/2016   CALCIUM 10.0 08/30/2020   Lab Results  Component Value Date   CHOL 213 (H) 08/30/2020   Lab Results  Component Value Date   HDL 54 08/30/2020   Lab Results  Component Value Date   LDLCALC 135 (H) 08/30/2020   Lab Results  Component Value Date   TRIG 127 08/30/2020   Lab Results  Component Value Date   CHOLHDL 3.9 08/30/2020   Lab Results  Component Value Date   HGBA1C 5.6 08/30/2020      Assessment & Plan:  Shingrix given in left deltoid without complications. Pt tolerated well.   Problem List Items Addressed This Visit   None Visit Diagnoses     Need for shingles vaccine    -  Primary   Relevant Orders   Varicella-zoster vaccine IM (Shingrix)       No orders of the defined types were placed in this encounter.   Follow-up: No follow-ups on file.    Ninfa Meeker, CMA

## 2020-12-24 ENCOUNTER — Other Ambulatory Visit: Payer: Self-pay

## 2020-12-24 ENCOUNTER — Encounter: Payer: Self-pay | Admitting: Family Medicine

## 2020-12-24 ENCOUNTER — Ambulatory Visit: Payer: BC Managed Care – PPO | Admitting: Family Medicine

## 2020-12-24 VITALS — BP 131/75 | HR 98 | Temp 98.3°F | Ht 69.0 in | Wt 208.0 lb

## 2020-12-24 DIAGNOSIS — J0140 Acute pansinusitis, unspecified: Secondary | ICD-10-CM | POA: Diagnosis not present

## 2020-12-24 DIAGNOSIS — H1032 Unspecified acute conjunctivitis, left eye: Secondary | ICD-10-CM | POA: Diagnosis not present

## 2020-12-24 MED ORDER — FLUTICASONE PROPIONATE 50 MCG/ACT NA SUSP
2.0000 | Freq: Every day | NASAL | 6 refills | Status: DC
Start: 2020-12-24 — End: 2022-04-14

## 2020-12-24 MED ORDER — AMOXICILLIN-POT CLAVULANATE 875-125 MG PO TABS: 1.0000 | ORAL_TABLET | Freq: Two times a day (BID) | ORAL | 0 refills | Status: AC

## 2020-12-24 MED ORDER — POLYMYXIN B-TRIMETHOPRIM 10000-0.1 UNIT/ML-% OP SOLN
1.0000 [drp] | OPHTHALMIC | 0 refills | Status: DC
Start: 2020-12-24 — End: 2021-07-14

## 2020-12-24 NOTE — Patient Instructions (Addendum)
For your sinus infection take Augmentin and start using Flonase nasal spray. Continue supportive measures including rest, hydration, humidifier use, steam showers, warm compresses to sinuses, warm liquids with lemon and honey, and over-the-counter cough, cold, and analgesics as needed.    For your eye (conjunctivitis), I do not think this is bacterial based on your presentation - could be irritant or viral. Use saline drops/lubricant to prevent drying/irritation. If you develop eye discomfort and the purulent discharge returns or you wake up with "crusts" on your eye, then you can start the antibiotic eye drops. Otherwise, it will run its course. Use warm compresses and keep eye clean - try to not touch your eyes. If you develop severe pain or vision changes, go to the eye doctor or emergency department.

## 2020-12-24 NOTE — Progress Notes (Signed)
Acute Office Visit  Subjective:    Patient ID: Joel Perry, male    DOB: [birth year], 55 y.o.   MRN: [id number]  Chief Complaint  Patient presents with   Nasal Congestion    X - 1 week   Conjunctivitis    X 1 day     Patient is in today for nasal congestion and conjunctivitis.   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 He is having trouble sleeping at night because of the congestion. He denies any chest pain, dyspnea, wheezing, nausea, vomiting, diarrhea, ear pain. Additionally, he reports that last night he noticed his left eye had some clear/white drainage and was more red than usual. He noticed it after swapping out basketball jerseys in the laundry. States there has not been much drainage. He never had any pain, itching, vision changes.      Past Medical History:  Diagnosis Date   Tenonitis    of AC    Past Surgical History:  Procedure Laterality Date   EYE SURGERY Right     No family history on file.  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   Tobacco Use   Smoking status: Never   Smokeless tobacco: Never  Substance and Sexual Activity   Alcohol use: Yes    Alcohol/week: 0.0 standard drinks    Comment: rare   Drug use: No   Sexual activity: Not on file  Other Topics Concern   Not on file  Social History Narrative   Works out regularly every other day.  Some caffeine.     Social Determinants of Health   Financial Resource Strain: Not on file  Food Insecurity: Not on file  Transportation Needs: Not on file  Physical Activity: Not on file  Stress: Not on file   Social Connections: Not on file  Intimate Partner Violence: Not on file    Outpatient Medications Prior to Visit  Medication Sig Dispense Refill   Diclofenac Sodium 2 % SOLN Place 2 sprays onto the skin 2 (two) times daily. 6 g 0   rosuvastatin (CRESTOR) 5 MG tablet Take 1 tablet (5 mg total) by mouth daily at 6 PM. 90 tablet 3   tadalafil (CIALIS) 5 MG tablet Take 1 tablet (5 mg total) by mouth daily as needed. 90 tablet 3   triamcinolone cream (KENALOG) 0.5 % Apply 1 application topically 2 (two) times daily. To affected areas. 30 g 3   No facility-administered medications prior to visit.    No Known Allergies  Review of Systems All review of systems negative except what is listed in the HPI     Objective:    Physical Exam Vitals reviewed.  Constitutional:      Appearance: Normal appearance.  HENT:     Head: Normocephalic and atraumatic.     Right Ear: Tympanic membrane normal.     Left Ear: Tympanic membrane normal.     Nose: Congestion present.     Mouth/Throat:     Mouth: Mucous membranes are moist.     Pharynx: Oropharynx is clear.  Eyes:     General:        Right eye: No discharge.        Left eye: No discharge.     Extraocular Movements: Extraocular movements intact.     Pupils: Pupils are equal, round, and reactive to light.     Comments: Left eye with mild injection/conjunctivitis   Cardiovascular:     Rate and Rhythm: Normal rate and regular rhythm.     Heart sounds: Normal heart sounds.  Pulmonary:     Effort: Pulmonary effort is normal.     Breath sounds: Normal breath sounds.  Skin:    Capillary Refill: Capillary refill takes less than 2 seconds.  Neurological:     General: No focal deficit present.     Mental Status: He is alert and oriented to person, place, and time.  Psychiatric:        Mood and Affect: Mood normal.        Behavior: Behavior normal.        Thought Content: Thought content normal.        Judgment: Judgment normal.    BP  131/75 (BP Location: Left Arm, Patient Position: Sitting, Cuff Size: Large)    Pulse 98    Temp 98.3 F (36.8 C) (Oral)    Ht 5\' 9"  (1.753 m)    Wt 208 lb 0.6 oz (94.4 kg)    SpO2 97%    BMI 30.72 kg/m  Wt Readings from Last 3 Encounters:  12/24/20 208 lb 0.6 oz (94.4 kg)  08/30/20 212 lb (96.2 kg)  08/25/19 212 lb (96.2 kg)    Health Maintenance Due  Topic Date Due   Fecal DNA (Cologuard)  11/01/2020    There are no preventive care reminders to display for this patient.   Lab Results  Component Value Date   TSH 2.30 08/30/2020   Lab Results  Component Value Date   WBC 3.9 08/30/2020   HGB 15.6 08/30/2020   HCT 48.8 08/30/2020   MCV 82.3 08/30/2020   PLT 187 08/30/2020   Lab Results  Component Value Date   NA 139 08/30/2020   K 4.7 08/30/2020   CO2 32 08/30/2020   GLUCOSE 97 08/30/2020   BUN 16 08/30/2020   CREATININE 1.06 08/30/2020   BILITOT 0.6 08/30/2020   ALKPHOS 65 05/23/2016   AST 24 08/30/2020   ALT 36 08/30/2020   PROT 7.5 08/30/2020   ALBUMIN 4.5 05/23/2016   CALCIUM 10.0 08/30/2020   Lab Results  Component Value Date   CHOL 213 (H) 08/30/2020   Lab Results  Component Value Date   HDL 54 08/30/2020   Lab Results  Component Value Date   LDLCALC 135 (H) 08/30/2020   Lab Results  Component Value Date   TRIG 127 08/30/2020   Lab Results  Component Value Date   CHOLHDL 3.9 08/30/2020   Lab Results  Component Value Date   HGBA1C 5.6 08/30/2020       Assessment & Plan:   1. Acute non-recurrent pansinusitis For your sinus infection take Augmentin and start using Flonase nasal spray. Continue supportive measures including rest, hydration, humidifier use, steam showers, warm compresses to sinuses, warm liquids with lemon and honey, and over-the-counter cough, cold, and analgesics as needed.   - fluticasone (FLONASE) 50 MCG/ACT nasal spray; Place 2 sprays into both nostrils daily.  Dispense: 16 g; Refill: 6 - amoxicillin-clavulanate  (AUGMENTIN) 875-125 MG tablet; Take 1 tablet by mouth 2 (two) times daily  for 7 days.  Dispense: 14 tablet; Refill: 0  2. Acute conjunctivitis of left eye, unspecified acute conjunctivitis type For your eye (conjunctivitis), I do not think this is bacterial based on your presentation - could be irritant or viral. Use saline drops/lubricant to prevent drying/irritation. If you develop eye discomfort and the purulent discharge returns or you wake up with "crusts" on your eye, then you can start the antibiotic eye drops. Otherwise, it will run its course. Use warm compresses and keep eye clean - try to not touch your eyes. If you develop severe pain or vision changes, go to the eye doctor or emergency department.   - trimethoprim-polymyxin b (POLYTRIM) ophthalmic solution; Place 1 drop into the right eye every 4 (four) hours. For 7 days  Dispense: 10 mL; Refill: 0   Follow-up if symptoms worsen or fail to improve.   [facility] [employer] Olevia Bowens, DNP, FNP-C

## 2021-03-03 LAB — HM DIABETES EYE EXAM

## 2021-03-04 ENCOUNTER — Encounter: Payer: Self-pay | Admitting: Sports Medicine

## 2021-03-22 DIAGNOSIS — Z1211 Encounter for screening for malignant neoplasm of colon: Secondary | ICD-10-CM | POA: Diagnosis not present

## 2021-03-30 LAB — COLOGUARD: COLOGUARD: NEGATIVE

## 2021-05-12 ENCOUNTER — Ambulatory Visit (INDEPENDENT_AMBULATORY_CARE_PROVIDER_SITE_OTHER): Payer: BC Managed Care – PPO

## 2021-05-12 ENCOUNTER — Encounter: Payer: Self-pay | Admitting: Sports Medicine

## 2021-05-12 ENCOUNTER — Ambulatory Visit: Payer: BC Managed Care – PPO | Admitting: Sports Medicine

## 2021-05-12 DIAGNOSIS — E291 Testicular hypofunction: Secondary | ICD-10-CM | POA: Diagnosis not present

## 2021-05-12 DIAGNOSIS — M25531 Pain in right wrist: Secondary | ICD-10-CM | POA: Insufficient documentation

## 2021-05-12 DIAGNOSIS — E119 Type 2 diabetes mellitus without complications: Secondary | ICD-10-CM | POA: Diagnosis not present

## 2021-05-12 DIAGNOSIS — M79631 Pain in right forearm: Secondary | ICD-10-CM | POA: Diagnosis not present

## 2021-05-12 DIAGNOSIS — N529 Male erectile dysfunction, unspecified: Secondary | ICD-10-CM | POA: Diagnosis not present

## 2021-05-12 MED ORDER — TADALAFIL 5 MG PO TABS: 5.0000 mg | ORAL_TABLET | Freq: Every day | ORAL | 3 refills | Status: DC | PRN

## 2021-05-12 NOTE — Assessment & Plan Note (Signed)
Rechecking testosterone levels, PSA, although I think [name] low libido has more to do with his erectile dysfunction. ?

## 2021-05-12 NOTE — Progress Notes (Signed)
? ? ?  Procedures performed today:   ? ?None. ? ?Independent interpretation of notes and tests performed by another provider:  ? ?None. ? ?Brief History, Exam, Impression, and Recommendations:   ? ?Male hypogonadism ?Rechecking testosterone levels, PSA, although I think [name] low libido has more to do with his erectile dysfunction. ? ?Erectile dysfunction ?Known erectile dysfunction, refilling Cialis, I think his low libido complaint is more due to poor erectile quality rather than actual low sex drive. ?We discussed Cialis as well as a penis ring, we will start a penis ring if no better after about a month of 5 mg daily Cialis. ? ? ?Acute pain of right wrist ?[name] also has relatively acute ulnar-sided wrist pain without trauma, he localizes it on the volar aspect of the tip of the ulna, not so much over the extensor carpi ulnaris. ?Adding x-rays, continue home bracing, TFCC conditioning given, return to see me in 4 to 6 weeks for this, we will do an MR arthrogram if not better. ? ?Chronic process with exacerbation and pharmacologic intervention set at the level for ? ?___________________________________________ ?Gwen Her. Dianah Field, M.D., ABFM., CAQSM. ?Primary Care and Sports Medicine ?[place] ? ?Adjunct Instructor of Family Medicine  ?[employer] ?

## 2021-05-12 NOTE — Assessment & Plan Note (Signed)
Known erectile dysfunction, refilling Cialis, I think his low libido complaint is more due to poor erectile quality rather than actual low sex drive. ?We discussed Cialis as well as a penis ring, we will start a penis ring if no better after about a month of 5 mg daily Cialis. ? ?

## 2021-05-12 NOTE — Assessment & Plan Note (Signed)
Huriel also has relatively acute ulnar-sided wrist pain without trauma, he localizes it on the volar aspect of the tip of the ulna, not so much over the extensor carpi ulnaris. ?Adding x-rays, continue home bracing, TFCC conditioning given, return to see me in 4 to 6 weeks for this, we will do an MR arthrogram if not better. ?

## 2021-05-17 LAB — CBC
HCT: 47.2 % (ref 38.5–50.0)
Hemoglobin: 15.1 g/dL (ref 13.2–17.1)
MCH: 26.3 pg — ABNORMAL LOW (ref 27.0–33.0)
MCHC: 32 g/dL (ref 32.0–36.0)
MCV: 82.2 fL (ref 80.0–100.0)
MPV: 11.3 fL (ref 7.5–12.5)
Platelets: 182 10*3/uL (ref 140–400)
RBC: 5.74 10*6/uL (ref 4.20–5.80)
RDW: 13.5 % (ref 11.0–15.0)
WBC: 4.4 10*3/uL (ref 3.8–10.8)

## 2021-05-17 LAB — COMPREHENSIVE METABOLIC PANEL
AG Ratio: 1.5 (calc) (ref 1.0–2.5)
ALT: 41 U/L (ref 9–46)
AST: 36 U/L — ABNORMAL HIGH (ref 10–35)
Albumin: 4.3 g/dL (ref 3.6–5.1)
Alkaline phosphatase (APISO): 89 U/L (ref 35–144)
BUN/Creatinine Ratio: 11 (calc) (ref 6–22)
BUN: 15 mg/dL (ref 7–25)
CO2: 29 mmol/L (ref 20–32)
Calcium: 10 mg/dL (ref 8.6–10.3)
Chloride: 103 mmol/L (ref 98–110)
Creat: 1.33 mg/dL — ABNORMAL HIGH (ref 0.70–1.30)
Globulin: 2.9 g/dL (calc) (ref 1.9–3.7)
Glucose, Bld: 74 mg/dL (ref 65–139)
Potassium: 4.4 mmol/L (ref 3.5–5.3)
Sodium: 141 mmol/L (ref 135–146)
Total Bilirubin: 0.4 mg/dL (ref 0.2–1.2)
Total Protein: 7.2 g/dL (ref 6.1–8.1)

## 2021-05-17 LAB — HEMOGLOBIN A1C
Hgb A1c MFr Bld: 6 % of total Hgb — ABNORMAL HIGH (ref <5.7)
Mean Plasma Glucose: 126 mg/dL
eAG (mmol/L): 7 mmol/L

## 2021-05-17 LAB — TSH: TSH: 1.92 mIU/L (ref 0.40–4.50)

## 2021-05-17 LAB — TESTOSTERONE, FREE & TOTAL
Free Testosterone: 49.9 pg/mL (ref 35.0–155.0)
Testosterone, Total, LC-MS-MS: 272 ng/dL (ref 250–1100)

## 2021-05-17 LAB — PSA, TOTAL AND FREE
PSA, % Free: 12 % (calc) — ABNORMAL LOW (ref >25)
PSA, Free: 0.3 ng/mL
PSA, Total: 2.5 ng/mL (ref <=4.0)

## 2021-06-09 ENCOUNTER — Ambulatory Visit: Payer: BC Managed Care – PPO | Admitting: Sports Medicine

## 2021-06-09 DIAGNOSIS — L989 Disorder of the skin and subcutaneous tissue, unspecified: Secondary | ICD-10-CM

## 2021-06-09 DIAGNOSIS — E291 Testicular hypofunction: Secondary | ICD-10-CM | POA: Diagnosis not present

## 2021-06-09 DIAGNOSIS — M25531 Pain in right wrist: Secondary | ICD-10-CM | POA: Diagnosis not present

## 2021-06-09 DIAGNOSIS — N139 Obstructive and reflux uropathy, unspecified: Secondary | ICD-10-CM | POA: Diagnosis not present

## 2021-06-09 MED ORDER — TESTOSTERONE CYPIONATE 200 MG/ML IM SOLN
200.0000 mg | Freq: Once | INTRAMUSCULAR | Status: AC
  Administered 2021-06-09: 200 mg via INTRAMUSCULAR

## 2021-06-09 MED ORDER — "SYRINGE/NEEDLE (DISP) 18G X 1-1/2"" 3 ML MISC": 0 refills | Status: DC

## 2021-06-09 MED ORDER — TESTOSTERONE CYPIONATE 200 MG/ML IM SOLN: 200.0000 mg | INTRAMUSCULAR | 0 refills | Status: DC

## 2021-06-09 MED ORDER — "NEEDLE (DISP) 22G X 1-1/2"" MISC": 0 refills | Status: DC

## 2021-06-09 NOTE — Progress Notes (Signed)
    Procedures performed today:    Procedure:  Cryodestruction of scalp verruca Consent obtained and verified. Time-out conducted. Noted no overlying erythema, induration, or other signs of local infection. Completed without difficulty using Cryo-Gun. Advised to call if fevers/chills, erythema, induration, drainage, or persistent bleeding.  Independent interpretation of notes and tests performed by another provider:   None.  Brief History, Exam, Impression, and Recommendations:    Acute pain of right wrist Resolved with conservative treatments and improved ergonomics at his office desk.  Male hypogonadism Testosterone is low in the 200s, he does have low sex drive as well. We added Cialis at the last visit, this did improve his erectile function but did not improve his sex drive. For this reason we will do a trial of testosterone supplementation, he would like to try every 2 week injections at home, we will have him do his first injection here in the office. I would like to recheck his testosterone levels approximately a week after his third injection. We will also reevaluate his symptomatology.  Obstructive uropathy Improved significantly with daily Cialis.  Skin lesion of scalp Verruca noted on scalp, front right, cryotherapy as above. We can repeat cryotherapy if needed at his follow-up.    ___________________________________________ Gwen Her. Dianah Field, M.D., ABFM., CAQSM. Primary Care and [place] Instructor of [place] of [facility] of Medicine

## 2021-06-09 NOTE — Assessment & Plan Note (Signed)
Improved significantly with daily Cialis.

## 2021-06-09 NOTE — Patient Instructions (Signed)
Get labs checked 1 week after third testosterone injection.

## 2021-06-09 NOTE — Assessment & Plan Note (Signed)
Testosterone is low in the 200s, he does have low sex drive as well. We added Cialis at the last visit, this did improve his erectile function but did not improve his sex drive. For this reason we will do a trial of testosterone supplementation, he would like to try every 2 week injections at home, we will have him do his first injection here in the office. I would like to recheck his testosterone levels approximately a week after his third injection. We will also reevaluate his symptomatology.

## 2021-06-09 NOTE — Assessment & Plan Note (Signed)
Resolved with conservative treatments and improved ergonomics at his office desk.

## 2021-06-09 NOTE — Addendum Note (Signed)
Addended by: Andria Rhein L on: 06/09/2021 11:45 AM   Modules accepted: Orders

## 2021-06-09 NOTE — Assessment & Plan Note (Signed)
Verruca noted on scalp, front right, cryotherapy as above. We can repeat cryotherapy if needed at his follow-up.

## 2021-07-13 DIAGNOSIS — E291 Testicular hypofunction: Secondary | ICD-10-CM | POA: Diagnosis not present

## 2021-07-14 ENCOUNTER — Ambulatory Visit: Payer: BC Managed Care – PPO | Admitting: Sports Medicine

## 2021-07-14 DIAGNOSIS — N529 Male erectile dysfunction, unspecified: Secondary | ICD-10-CM

## 2021-07-14 DIAGNOSIS — E291 Testicular hypofunction: Secondary | ICD-10-CM

## 2021-07-14 NOTE — Assessment & Plan Note (Signed)
Still doing well with cialis 5 daily.

## 2021-07-14 NOTE — Assessment & Plan Note (Signed)
Testosterone was in the low 200s, had low sex drive, he has now done 3 shots of testosterone cypionate 200 mg IM, his last shot was 1 week ago, rechecking testosterone, CBC, PSA today. Does tell me his sex drive might be a touch better.

## 2021-07-14 NOTE — Progress Notes (Signed)
    Procedures performed today:    None.  Independent interpretation of notes and tests performed by another provider:   None.  Brief History, Exam, Impression, and Recommendations:    Erectile dysfunction Still doing well with cialis 5 daily.  Male hypogonadism Testosterone was in the low 200s, had low sex drive, he has now done 3 shots of testosterone cypionate 200 mg IM, his last shot was 1 week ago, rechecking testosterone, CBC, PSA today. Does tell me his sex drive might be a touch better.    ____________________________________________ Gwen Her. Dianah Field, M.D., ABFM., CAQSM., AME. Primary Care and Sports Medicine [place] [facility]  Adjunct Professor of [place] of [facility] of Medicine  Risk manager

## 2021-07-17 LAB — CBC
HCT: 47.6 % (ref 38.5–50.0)
Hemoglobin: 15.2 g/dL (ref 13.2–17.1)
MCH: 26.5 pg — ABNORMAL LOW (ref 27.0–33.0)
MCHC: 31.9 g/dL — ABNORMAL LOW (ref 32.0–36.0)
MCV: 82.9 fL (ref 80.0–100.0)
MPV: 10.6 fL (ref 7.5–12.5)
Platelets: 204 10*3/uL (ref 140–400)
RBC: 5.74 10*6/uL (ref 4.20–5.80)
RDW: 13.9 % (ref 11.0–15.0)
WBC: 5.2 10*3/uL (ref 3.8–10.8)

## 2021-07-17 LAB — PSA, TOTAL AND FREE
PSA, % Free: 12 % (calc) — ABNORMAL LOW (ref >25)
PSA, Free: 0.3 ng/mL
PSA, Total: 2.5 ng/mL (ref <=4.0)

## 2021-07-17 LAB — TESTOSTERONE, FREE & TOTAL
Free Testosterone: 419.8 pg/mL — ABNORMAL HIGH (ref 35.0–155.0)
Testosterone, Total, LC-MS-MS: 1387 ng/dL — ABNORMAL HIGH (ref 250–1100)

## 2021-07-18 LAB — CBC
HCT: 48.2 % (ref 38.5–50.0)
Hemoglobin: 15.3 g/dL (ref 13.2–17.1)
MCH: 26.5 pg — ABNORMAL LOW (ref 27.0–33.0)
MCHC: 31.7 g/dL — ABNORMAL LOW (ref 32.0–36.0)
MCV: 83.5 fL (ref 80.0–100.0)
MPV: 11.1 fL (ref 7.5–12.5)
Platelets: 200 10*3/uL (ref 140–400)
RBC: 5.77 10*6/uL (ref 4.20–5.80)
RDW: 14 % (ref 11.0–15.0)
WBC: 6.4 10*3/uL (ref 3.8–10.8)

## 2021-07-18 LAB — TESTOSTERONE, FREE & TOTAL
Free Testosterone: 344.6 pg/mL — ABNORMAL HIGH (ref 35.0–155.0)
Testosterone, Total, LC-MS-MS: 1300 ng/dL — ABNORMAL HIGH (ref 250–1100)

## 2021-07-18 LAB — PSA, TOTAL AND FREE
PSA, % Free: 15 % (calc) — ABNORMAL LOW (ref >25)
PSA, Free: 0.4 ng/mL
PSA, Total: 2.7 ng/mL (ref <=4.0)

## 2021-08-31 ENCOUNTER — Ambulatory Visit (INDEPENDENT_AMBULATORY_CARE_PROVIDER_SITE_OTHER): Payer: BC Managed Care – PPO

## 2021-08-31 ENCOUNTER — Encounter: Payer: Self-pay | Admitting: Sports Medicine

## 2021-08-31 ENCOUNTER — Ambulatory Visit (INDEPENDENT_AMBULATORY_CARE_PROVIDER_SITE_OTHER): Payer: BC Managed Care – PPO | Admitting: Sports Medicine

## 2021-08-31 VITALS — BP 99/70 | HR 69 | Ht 69.0 in | Wt 207.0 lb

## 2021-08-31 DIAGNOSIS — E119 Type 2 diabetes mellitus without complications: Secondary | ICD-10-CM | POA: Diagnosis not present

## 2021-08-31 DIAGNOSIS — M545 Low back pain, unspecified: Secondary | ICD-10-CM

## 2021-08-31 DIAGNOSIS — Z23 Encounter for immunization: Secondary | ICD-10-CM

## 2021-08-31 DIAGNOSIS — Z0000 Encounter for general adult medical examination without abnormal findings: Secondary | ICD-10-CM | POA: Diagnosis not present

## 2021-08-31 DIAGNOSIS — G8929 Other chronic pain: Secondary | ICD-10-CM | POA: Diagnosis not present

## 2021-08-31 LAB — POCT UA - MICROALBUMIN
Albumin/Creatinine Ratio, Urine, POC: <30
Creatinine, POC: 300 mg/dL
Microalbumin Ur, POC: 80 mg/L

## 2021-08-31 MED ORDER — PREDNISONE 50 MG PO TABS: ORAL_TABLET | ORAL | 0 refills | Status: DC

## 2021-08-31 NOTE — Assessment & Plan Note (Signed)
Annual physical as above, we did his microalbumin today, flu shot today, he is up-to-date on screenings. Return in a year.

## 2021-08-31 NOTE — Progress Notes (Signed)
  Subjective:    CC: Annual Physical Exam  HPI:  This patient is here for their annual physical  I reviewed the past medical history, family history, social history, surgical history, and allergies today and no changes were needed.  Please see the problem list section below in epic for further details.  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   Tobacco Use   Smoking status: Never   Smokeless tobacco: Never  Substance and Sexual Activity   Alcohol use: Yes    Alcohol/week: 0.0 standard drinks of alcohol    Comment: rare   Drug use: No   Sexual activity: Not on file  Other Topics Concern   Not on file  Social History Narrative   Works out regularly every other day.  Some caffeine.     Social Determinants of Health   Financial Resource Strain: Not on file  Food Insecurity: Not on file  Transportation Needs: Not on file  Physical Activity: Not on file  Stress: Not on file  Social Connections: Not on file   Family History: No family history on file. Allergies: No Known Allergies Medications: See med rec.  Review of Systems: No headache, visual changes, nausea, vomiting, diarrhea, constipation, dizziness, abdominal pain, skin rash, fevers, chills, night sweats, swollen lymph nodes, weight loss, chest pain, body aches, joint swelling, muscle aches, shortness of breath, mood changes, visual or auditory hallucinations.  Objective:    General: Well Developed, well nourished, and in no acute distress.  Neuro: Alert and oriented x3, extra-ocular muscles  intact, sensation grossly intact. Cranial nerves II through XII are intact, motor, sensory, and coordinative functions are all intact. HEENT: Normocephalic, atraumatic, pupils equal round reactive to light, neck supple, no masses, no lymphadenopathy, thyroid nonpalpable. Oropharynx, nasopharynx, external ear canals are unremarkable. Skin: Warm and dry, no rashes noted.  Cardiac: Regular rate and rhythm, no murmurs rubs or gallops.  Respiratory: Clear to auscultation bilaterally. Not using accessory muscles, speaking in full sentences.  Abdominal: Soft, nontender, nondistended, positive bowel sounds, no masses, no organomegaly.  Musculoskeletal: Shoulder, elbow, wrist, hip, knee, ankle stable, and with full range of motion.  Impression and Recommendations:    The patient was counselled, risk factors were discussed, anticipatory guidance given.  Annual physical exam Annual physical as above, we did his microalbumin today, flu shot today, he is up-to-date on screenings. Return in a year.  Low back pain Chronic axial low back pain, we discussed this in 2014, got some x-rays, I would like updated x-rays, adding a 5-day burst of prednisone. Home conditioning given, return to see me if not better in 6 weeks.  ____________________________________________ Gwen Her. Dianah Field, M.D., ABFM., CAQSM., AME. Primary Care and Sports Medicine [place] [facility]  Adjunct Professor of [place] [employer] [facility] of Medicine  Risk manager

## 2021-08-31 NOTE — Assessment & Plan Note (Signed)
Chronic axial low back pain, we discussed this in 2014, got some x-rays, I would like updated x-rays, adding a 5-day burst of prednisone. Home conditioning given, return to see me if not better in 6 weeks.

## 2021-09-01 ENCOUNTER — Encounter: Payer: Self-pay | Admitting: Sports Medicine

## 2021-09-01 DIAGNOSIS — E291 Testicular hypofunction: Secondary | ICD-10-CM

## 2021-09-01 MED ORDER — TESTOSTERONE CYPIONATE 200 MG/ML IM SOLN: 200.0000 mg | INTRAMUSCULAR | 0 refills | Status: DC

## 2021-09-05 ENCOUNTER — Encounter: Payer: Self-pay | Admitting: Sports Medicine

## 2021-09-05 ENCOUNTER — Other Ambulatory Visit: Payer: Self-pay | Admitting: Sports Medicine

## 2021-09-05 DIAGNOSIS — E785 Hyperlipidemia, unspecified: Secondary | ICD-10-CM

## 2021-09-05 DIAGNOSIS — N529 Male erectile dysfunction, unspecified: Secondary | ICD-10-CM

## 2021-09-05 DIAGNOSIS — E291 Testicular hypofunction: Secondary | ICD-10-CM

## 2021-09-06 MED ORDER — ROSUVASTATIN CALCIUM 5 MG PO TABS: 5.0000 mg | ORAL_TABLET | Freq: Every day | ORAL | 3 refills | Status: DC

## 2021-09-06 MED ORDER — TESTOSTERONE CYPIONATE 200 MG/ML IM SOLN: 200.0000 mg | INTRAMUSCULAR | 0 refills | Status: DC

## 2021-09-06 MED ORDER — TADALAFIL 5 MG PO TABS: 5.0000 mg | ORAL_TABLET | Freq: Every day | ORAL | 3 refills | Status: DC | PRN

## 2021-09-06 NOTE — Addendum Note (Signed)
Addended by: Silverio Decamp on: 09/06/2021 05:03 PM   Modules accepted: Orders

## 2021-09-14 ENCOUNTER — Other Ambulatory Visit: Payer: Self-pay | Admitting: Sports Medicine

## 2021-09-14 DIAGNOSIS — N529 Male erectile dysfunction, unspecified: Secondary | ICD-10-CM

## 2021-09-14 MED ORDER — TADALAFIL 10 MG PO TABS: 5.0000 mg | ORAL_TABLET | Freq: Every day | ORAL | 3 refills | Status: DC | PRN

## 2021-09-16 MED ORDER — ROSUVASTATIN CALCIUM 5 MG PO TABS: 5.0000 mg | ORAL_TABLET | Freq: Every day | ORAL | 3 refills | Status: DC

## 2021-10-12 ENCOUNTER — Ambulatory Visit: Payer: BC Managed Care – PPO | Admitting: Sports Medicine

## 2021-10-12 ENCOUNTER — Other Ambulatory Visit: Payer: Self-pay

## 2021-10-12 DIAGNOSIS — M545 Low back pain, unspecified: Secondary | ICD-10-CM | POA: Diagnosis not present

## 2021-10-12 DIAGNOSIS — M25512 Pain in left shoulder: Secondary | ICD-10-CM

## 2021-10-12 DIAGNOSIS — R21 Rash and other nonspecific skin eruption: Secondary | ICD-10-CM

## 2021-10-12 DIAGNOSIS — G5621 Lesion of ulnar nerve, right upper limb: Secondary | ICD-10-CM | POA: Diagnosis not present

## 2021-10-12 DIAGNOSIS — G5601 Carpal tunnel syndrome, right upper limb: Secondary | ICD-10-CM

## 2021-10-12 DIAGNOSIS — G8929 Other chronic pain: Secondary | ICD-10-CM

## 2021-10-12 MED ORDER — DICLOFENAC SODIUM 2 % EX SOLN: 2.0000 | Freq: Two times a day (BID) | CUTANEOUS | 2 refills | Status: AC

## 2021-10-12 NOTE — Progress Notes (Signed)
    Procedures performed today:    None.  Independent interpretation of notes and tests performed by another provider:   None.  Brief History, Exam, Impression, and Recommendations:    Low back pain Joel Perry returns, he is a pleasant 56 year old male, we have been treating him for chronic axial low back pain for a long time now, x-rays did show multilevel lumbar facet arthropathy. At the last visit we did a burst of prednisone and some advanced herniated disc home conditioning, he has improved considerably, he only has intermittent pain and this is controlled with Voltaren.  Carpal tunnel syndrome, right Continue nighttime splinting, Voltaren as needed, carpal tunnel exercises given again, return to see me as needed for this.  Cubital tunnel syndrome, right Numbness and tingling fourth and fifth fingers particularly at night, explained the pathophysiology and the anatomy with cubital tunnel syndrome. We will do some ulnar nerve gliding exercises. See me in 6 weeks as needed, we will consider a ulnar nerve hydrodissection if not better.  Rash of bilateral axillae Joel Perry is also noted a slightly pruritic hyperpigmented rash bilateral axillae, present for several weeks. He does trim up, with clippers but not a razor, I have advised him to avoid trimming for the next month and switch deodorant. We will revisit this in 4 to 6 weeks.  I spent 30 minutes of total time managing this patient today, this includes chart review, face to face, and non-face to face time.  ____________________________________________ Gwen Her. Dianah Field, M.D., ABFM., CAQSM., AME. Primary Care and Sports Medicine [place] [facility]  Adjunct Professor of [place] of [facility] of Medicine  Risk manager

## 2021-10-12 NOTE — Assessment & Plan Note (Signed)
Joel Perry returns, he is a pleasant 56 year old male, we have been treating him for chronic axial low back pain for a long time now, x-rays did show multilevel lumbar facet arthropathy. At the last visit we did a burst of prednisone and some advanced herniated disc home conditioning, he has improved considerably, he only has intermittent pain and this is controlled with Voltaren.

## 2021-10-12 NOTE — Assessment & Plan Note (Signed)
Continue nighttime splinting, Voltaren as needed, carpal tunnel exercises given again, return to see me as needed for this.

## 2021-10-12 NOTE — Assessment & Plan Note (Signed)
Joel Perry is also noted a slightly pruritic hyperpigmented rash bilateral axillae, present for several weeks. He does trim up, with clippers but not a razor, I have advised him to avoid trimming for the next month and switch deodorant. We will revisit this in 4 to 6 weeks.

## 2021-10-12 NOTE — Assessment & Plan Note (Signed)
Numbness and tingling fourth and fifth fingers particularly at night, explained the pathophysiology and the anatomy with cubital tunnel syndrome. We will do some ulnar nerve gliding exercises. See me in 6 weeks as needed, we will consider a ulnar nerve hydrodissection if not better.

## 2021-10-20 ENCOUNTER — Telehealth: Payer: Self-pay

## 2021-10-20 NOTE — Telephone Encounter (Signed)
Initiated Prior authorization [id number] Sodium 2% solution Via: Covermymeds Case/Key:[id number]  Status: Pending as of 10/20/21 Reason: Notified Pt via: Mychart

## 2021-10-28 ENCOUNTER — Encounter: Payer: Self-pay | Admitting: Sports Medicine

## 2021-10-28 DIAGNOSIS — M75101 Unspecified rotator cuff tear or rupture of right shoulder, not specified as traumatic: Secondary | ICD-10-CM

## 2021-10-31 MED ORDER — DICLOFENAC SODIUM 75 MG PO TBEC: 75.0000 mg | DELAYED_RELEASE_TABLET | Freq: Two times a day (BID) | ORAL | 11 refills | Status: DC

## 2021-11-23 ENCOUNTER — Ambulatory Visit: Payer: BC Managed Care – PPO | Admitting: Sports Medicine

## 2021-11-23 ENCOUNTER — Encounter: Payer: Self-pay | Admitting: Sports Medicine

## 2021-11-23 VITALS — Ht 69.0 in | Wt 200.0 lb

## 2021-11-23 DIAGNOSIS — E291 Testicular hypofunction: Secondary | ICD-10-CM

## 2021-11-23 DIAGNOSIS — G5621 Lesion of ulnar nerve, right upper limb: Secondary | ICD-10-CM | POA: Diagnosis not present

## 2021-11-23 MED ORDER — TESTOSTERONE CYPIONATE 200 MG/ML IM SOLN: 200.0000 mg | INTRAMUSCULAR | 0 refills | Status: DC

## 2021-11-23 NOTE — Assessment & Plan Note (Signed)
Increasing numbness and tingling fourth and fifth fingers right hand, has not really done the ulnar nerve gliding exercises. He will add the ulnar nerve gliding, try to sleep with elbows extended, and we can do an ulnar nerve hydrodissection at the cubital tunnel if not better in 6 weeks.

## 2021-11-23 NOTE — Progress Notes (Signed)
    Procedures performed today:    None.  Independent interpretation of notes and tests performed by another provider:   None.  Brief History, Exam, Impression, and Recommendations:    Cubital tunnel syndrome, right Increasing numbness and tingling fourth and fifth fingers right hand, has not really done the ulnar nerve gliding exercises. He will add the ulnar nerve gliding, try to sleep with elbows extended, and we can do an ulnar nerve hydrodissection at the cubital tunnel if not better in 6 weeks.    ____________________________________________ Gwen Her. Dianah Field, M.D., ABFM., CAQSM., AME. Primary Care and Sports Medicine [place] [facility]  Adjunct Professor of [place] of [facility] of Medicine  Risk manager

## 2022-01-04 ENCOUNTER — Ambulatory Visit: Payer: BC Managed Care – PPO | Admitting: Sports Medicine

## 2022-01-04 DIAGNOSIS — E119 Type 2 diabetes mellitus without complications: Secondary | ICD-10-CM

## 2022-01-04 DIAGNOSIS — G5621 Lesion of ulnar nerve, right upper limb: Secondary | ICD-10-CM | POA: Diagnosis not present

## 2022-01-04 DIAGNOSIS — N139 Obstructive and reflux uropathy, unspecified: Secondary | ICD-10-CM

## 2022-01-04 NOTE — Progress Notes (Signed)
    Procedures performed today:    None.  Independent interpretation of notes and tests performed by another provider:   None.  Brief History, Exam, Impression, and Recommendations:    Cubital tunnel syndrome, right Torian returns, he is a pleasant 57 year old male, right hand fourth and fifth digit numbness and tingling, he got more consistent with his ulnar nerve gliding exercises, sleeping with elbows extended and symptoms have resolved. Return as needed.  Diabetes mellitus type 2, controlled, without complications We have not checked lipids in some time, I will add all the routine labs and he can come back Friday and get them done fasting.    ____________________________________________ Gwen Her. Dianah Field, M.D., ABFM., CAQSM., AME. Primary Care and Sports Medicine [place] [facility]  Adjunct Professor of [place] of [facility] of Medicine  Risk manager

## 2022-01-04 NOTE — Assessment & Plan Note (Signed)
Joel Perry returns, he is a pleasant 58 year old male, right hand fourth and fifth digit numbness and tingling, he got more consistent with his ulnar nerve gliding exercises, sleeping with elbows extended and symptoms have resolved. Return as needed.

## 2022-01-04 NOTE — Assessment & Plan Note (Signed)
We have not checked lipids in some time, I will add all the routine labs and he can come back Friday and get them done fasting.

## 2022-01-06 DIAGNOSIS — E119 Type 2 diabetes mellitus without complications: Secondary | ICD-10-CM | POA: Diagnosis not present

## 2022-01-06 DIAGNOSIS — N139 Obstructive and reflux uropathy, unspecified: Secondary | ICD-10-CM | POA: Diagnosis not present

## 2022-01-08 ENCOUNTER — Encounter: Payer: Self-pay | Admitting: Sports Medicine

## 2022-01-09 LAB — CBC
HCT: 50 % (ref 38.5–50.0)
Hemoglobin: 16 g/dL (ref 13.2–17.1)
MCH: 26.6 pg — ABNORMAL LOW (ref 27.0–33.0)
MCHC: 32 g/dL (ref 32.0–36.0)
MCV: 83.1 fL (ref 80.0–100.0)
MPV: 11 fL (ref 7.5–12.5)
Platelets: 159 10*3/uL (ref 140–400)
RBC: 6.02 10*6/uL — ABNORMAL HIGH (ref 4.20–5.80)
RDW: 14.7 % (ref 11.0–15.0)
WBC: 5.1 10*3/uL (ref 3.8–10.8)

## 2022-01-09 LAB — COMPLETE METABOLIC PANEL WITH GFR
AG Ratio: 1.7 (calc) (ref 1.0–2.5)
ALT: 26 U/L (ref 9–46)
AST: 25 U/L (ref 10–35)
Albumin: 4.7 g/dL (ref 3.6–5.1)
Alkaline phosphatase (APISO): 55 U/L (ref 35–144)
BUN/Creatinine Ratio: 20 (calc) (ref 6–22)
BUN: 27 mg/dL — ABNORMAL HIGH (ref 7–25)
CO2: 28 mmol/L (ref 20–32)
Calcium: 10 mg/dL (ref 8.6–10.3)
Chloride: 102 mmol/L (ref 98–110)
Creat: 1.32 mg/dL — ABNORMAL HIGH (ref 0.70–1.30)
Globulin: 2.7 g/dL (calc) (ref 1.9–3.7)
Glucose, Bld: 89 mg/dL (ref 65–99)
Potassium: 4.7 mmol/L (ref 3.5–5.3)
Sodium: 140 mmol/L (ref 135–146)
Total Bilirubin: 0.7 mg/dL (ref 0.2–1.2)
Total Protein: 7.4 g/dL (ref 6.1–8.1)
eGFR: 64 mL/min/{1.73_m2} (ref >=60)

## 2022-01-09 LAB — PSA, TOTAL AND FREE
PSA, % Free: 14 % (calc) — ABNORMAL LOW (ref >25)
PSA, Free: 0.5 ng/mL
PSA, Total: 3.7 ng/mL (ref <=4.0)

## 2022-01-09 LAB — LIPID PANEL
Cholesterol: 119 mg/dL (ref <200)
HDL: 59 mg/dL (ref >=40)
LDL Cholesterol (Calc): 46 mg/dL (calc)
Non-HDL Cholesterol (Calc): 60 mg/dL (calc) (ref <130)
Total CHOL/HDL Ratio: 2 (calc) (ref <5.0)
Triglycerides: 67 mg/dL (ref <150)

## 2022-01-09 LAB — HEMOGLOBIN A1C
Hgb A1c MFr Bld: 5.8 % of total Hgb — ABNORMAL HIGH (ref <5.7)
Mean Plasma Glucose: 120 mg/dL
eAG (mmol/L): 6.6 mmol/L

## 2022-01-09 LAB — TSH: TSH: 2.29 mIU/L (ref 0.40–4.50)

## 2022-02-03 ENCOUNTER — Other Ambulatory Visit: Payer: Self-pay | Admitting: Sports Medicine

## 2022-02-03 DIAGNOSIS — E291 Testicular hypofunction: Secondary | ICD-10-CM

## 2022-02-03 MED ORDER — TESTOSTERONE CYPIONATE 200 MG/ML IM SOLN: 200.0000 mg | INTRAMUSCULAR | 0 refills | Status: DC

## 2022-02-16 ENCOUNTER — Telehealth: Payer: Self-pay

## 2022-02-16 NOTE — Telephone Encounter (Addendum)
Initiated Prior authorization [id number] Cypionate '200MG'$ /ML intramuscular solution Via: Covermymeds Case/Key:[id number]  Status: denied as of 02/16/22 Reason:not given/awaiting fax Notified Pt via: Mychart

## 2022-03-08 ENCOUNTER — Other Ambulatory Visit: Payer: Self-pay | Admitting: Sports Medicine

## 2022-03-08 DIAGNOSIS — N529 Male erectile dysfunction, unspecified: Secondary | ICD-10-CM

## 2022-03-09 LAB — HM DIABETES EYE EXAM

## 2022-03-13 ENCOUNTER — Encounter: Payer: Self-pay | Admitting: Sports Medicine

## 2022-03-20 NOTE — Telephone Encounter (Signed)
Pt states he has left messages over the past couple weeks trying to get his Refill for tadalafil (CIALIS) 10 MG tablet  testosterone cypionate (DEPOTESTOSTERONE CYPIONATE) 200 MG/ML injection    Please call pt and update

## 2022-03-24 ENCOUNTER — Telehealth: Payer: Self-pay

## 2022-03-24 DIAGNOSIS — N529 Male erectile dysfunction, unspecified: Secondary | ICD-10-CM

## 2022-03-24 MED ORDER — TADALAFIL 10 MG PO TABS: 5.0000 mg | ORAL_TABLET | Freq: Every day | ORAL | 3 refills | Status: DC | PRN

## 2022-03-24 NOTE — Addendum Note (Signed)
Addended by: Silverio Decamp on: 03/24/2022 04:58 PM   Modules accepted: Orders

## 2022-03-24 NOTE — Telephone Encounter (Signed)
Patient needs refill on Cialis and he needs a peer to peer for his testosterone please advise and no number was left for you to call.

## 2022-03-24 NOTE — Telephone Encounter (Signed)
Refilling Cialis, he just needs to use a good Rx coupon for his testosterone.

## 2022-04-07 ENCOUNTER — Other Ambulatory Visit: Payer: Self-pay | Admitting: Family Medicine

## 2022-04-07 DIAGNOSIS — J0140 Acute pansinusitis, unspecified: Secondary | ICD-10-CM

## 2022-04-14 ENCOUNTER — Other Ambulatory Visit: Payer: Self-pay | Admitting: Sports Medicine

## 2022-04-14 DIAGNOSIS — J0140 Acute pansinusitis, unspecified: Secondary | ICD-10-CM

## 2022-04-14 MED ORDER — FLUTICASONE PROPIONATE 50 MCG/ACT NA SUSP: 2.0000 | Freq: Every day | NASAL | 6 refills | Status: DC

## 2022-05-01 ENCOUNTER — Ambulatory Visit (INDEPENDENT_AMBULATORY_CARE_PROVIDER_SITE_OTHER): Payer: BC Managed Care – PPO | Admitting: Sports Medicine

## 2022-05-01 ENCOUNTER — Ambulatory Visit (INDEPENDENT_AMBULATORY_CARE_PROVIDER_SITE_OTHER): Payer: BC Managed Care – PPO

## 2022-05-01 DIAGNOSIS — E291 Testicular hypofunction: Secondary | ICD-10-CM | POA: Diagnosis not present

## 2022-05-01 DIAGNOSIS — I83813 Varicose veins of bilateral lower extremities with pain: Secondary | ICD-10-CM

## 2022-05-01 DIAGNOSIS — I82819 Embolism and thrombosis of superficial veins of unspecified lower extremities: Secondary | ICD-10-CM | POA: Diagnosis not present

## 2022-05-01 DIAGNOSIS — I8393 Asymptomatic varicose veins of bilateral lower extremities: Secondary | ICD-10-CM | POA: Insufficient documentation

## 2022-05-01 DIAGNOSIS — G5621 Lesion of ulnar nerve, right upper limb: Secondary | ICD-10-CM

## 2022-05-01 MED ORDER — TESTOSTERONE CYPIONATE 200 MG/ML IM SOLN: 200.0000 mg | INTRAMUSCULAR | 3 refills | Status: DC

## 2022-05-01 NOTE — Assessment & Plan Note (Signed)
Bilateral tortuous venous varicosities, potential clot right side, adding venous ultrasound. I will also set him up for the [facility].

## 2022-05-01 NOTE — Assessment & Plan Note (Signed)
Persistent paresthesias fourth and fifth fingers on the right, he has been more consistent with ulnar nerve gliding exercises and sleeping with elbows extended, symptoms have improved but they continue to come back. I do suspect this is ulnar neuropathy at the elbow but I would like a nerve conduction and EMG for confirmation. If confirmed median neuropathy at the elbow we will do a median nerve hydrodissection at the elbow.

## 2022-05-01 NOTE — Progress Notes (Signed)
    Procedures performed today:    None.  Independent interpretation of notes and tests performed by another provider:   None.  Brief History, Exam, Impression, and Recommendations:    Varicose veins of both lower extremities Bilateral tortuous venous varicosities, potential clot right side, adding venous ultrasound. I will also set him up for the [facility].  Cubital tunnel syndrome, right Persistent paresthesias fourth and fifth fingers on the right, he has been more consistent with ulnar nerve gliding exercises and sleeping with elbows extended, symptoms have improved but they continue to come back. I do suspect this is ulnar neuropathy at the elbow but I would like a nerve conduction and EMG for confirmation. If confirmed median neuropathy at the elbow we will do a median nerve hydrodissection at the elbow.    ____________________________________________ [name]. Benjamin Stain, M.D., ABFM., CAQSM., AME. Primary Care and Sports Medicine [place] [facility]  Adjunct Professor of Family Medicine  [place] of [facility] of Medicine  Restaurant manager, fast food

## 2022-05-24 ENCOUNTER — Encounter: Payer: Self-pay | Admitting: Sports Medicine

## 2022-06-20 ENCOUNTER — Encounter: Payer: Self-pay | Admitting: Sports Medicine

## 2022-06-23 ENCOUNTER — Other Ambulatory Visit (INDEPENDENT_AMBULATORY_CARE_PROVIDER_SITE_OTHER): Payer: 59

## 2022-06-23 ENCOUNTER — Ambulatory Visit: Payer: 59 | Admitting: Sports Medicine

## 2022-06-23 DIAGNOSIS — G5621 Lesion of ulnar nerve, right upper limb: Secondary | ICD-10-CM

## 2022-06-23 NOTE — Progress Notes (Signed)
    Procedures performed today:    Procedure: Real-time Ultrasound Guided hydrodissection of the right ulnar nerve at the cubital tunnel Device: Samsung HS60 Verbal informed consent obtained.  Time-out conducted.  Noted no overlying erythema, induration, or other signs of local infection.  Skin prepped in a sterile fashion.  Local anesthesia: Topical Ethyl chloride.  With sterile technique and under real time ultrasound guidance: I noted an enlarged nerve. Using a 25-gauge needle advanced into the cubital tunnel, taking care to avoid intraneural injection I injected medication both superficial to and deep to the ulnar nerve freeing it from surrounding structures, I used a total of 1 cc kenalog 40, 5 cc 1% lidocaine without epinephrine. Completed without difficulty  Advised to call if fevers/chills, erythema, induration, drainage, or persistent bleeding.  Images permanently stored and available for review in PACS.  Impression: Technically successful ultrasound guided ulnar nerve hydrodissection.  Independent interpretation of notes and tests performed by another provider:   None.  Brief History, Exam, Impression, and Recommendations:    Cubital tunnel syndrome, right Jourdain returns, he continues to have paresthesias right upper extremity, he has been doing the ulnar nerve gliding exercises, sleeping with elbows extended, nerve conduction and EMG showed bilateral median neuropathy at the wrist and right-sided ulnar neuropathy at the elbow. Due to persistence of discomfort we performed a ulnar nerve/cubital tunnel hydrodissection with ultrasound guidance. Return to see me in 6 weeks.    ____________________________________________ [name]. Benjamin Stain, M.D., ABFM., CAQSM., AME. Primary Care and Sports Medicine [place] [facility]  Adjunct Professor of Family Medicine  [place] of [facility] of Medicine  Restaurant manager, fast food

## 2022-06-23 NOTE — Assessment & Plan Note (Signed)
Travez returns, he continues to have paresthesias right upper extremity, he has been doing the ulnar nerve gliding exercises, sleeping with elbows extended, nerve conduction and EMG showed bilateral median neuropathy at the wrist and right-sided ulnar neuropathy at the elbow. Due to persistence of discomfort we performed a ulnar nerve/cubital tunnel hydrodissection with ultrasound guidance. Return to see me in 6 weeks.

## 2022-07-04 ENCOUNTER — Ambulatory Visit: Payer: 59 | Admitting: Sports Medicine

## 2022-07-04 ENCOUNTER — Ambulatory Visit (INDEPENDENT_AMBULATORY_CARE_PROVIDER_SITE_OTHER): Payer: 59

## 2022-07-04 DIAGNOSIS — G5621 Lesion of ulnar nerve, right upper limb: Secondary | ICD-10-CM | POA: Diagnosis not present

## 2022-07-04 DIAGNOSIS — M5030 Other cervical disc degeneration, unspecified cervical region: Secondary | ICD-10-CM

## 2022-07-04 NOTE — Progress Notes (Signed)
    Procedures performed today:    None.  Independent interpretation of notes and tests performed by another provider:   None.  Brief History, Exam, Impression, and Recommendations:    Cubital tunnel syndrome, right Joel Perry returns, he is a very pleasant 57 year old male, he has had chronic paresthesias right upper extremity and ulnar nerve distribution, he has done months of ulnar nerve gliding exercises, sleeping with elbows extended, ultimately nerve conduction and EMG showed bilateral median neuropathy at the wrist and right-sided ulnar neuropathy at the elbow, at the last visit about 2 weeks ago I performed an ulnar nerve/cubital tunnel hydrodissection with ultrasound guidance and he returns today with complete resolution of his pain. He does have some discomfort between the shoulder blades in the axilla, I think this is coming from his neck, we will treat him conservatively. Return to see me in 6 weeks.  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 Adding x-rays, home physical therapy, return to see me in 6 weeks.    ____________________________________________ [name]. Benjamin Stain, M.D., ABFM., CAQSM., AME. Primary Care and Sports Medicine [place] [facility]  Adjunct Professor of Family Medicine  [place] of [facility] of Medicine  Restaurant manager, fast food

## 2022-07-04 NOTE — Assessment & Plan Note (Signed)
Joel Perry does work at [employer] with 3 monitors, he has had some increasing discomfort between the shoulder blades with radiation down to the right axilla likely cervical radicular. Adding x-rays, home physical therapy, return to see me in 6 weeks.

## 2022-07-04 NOTE — Assessment & Plan Note (Signed)
Joel Perry returns, he is a very pleasant 57 year old male, he has had chronic paresthesias right upper extremity and ulnar nerve distribution, he has done months of ulnar nerve gliding exercises, sleeping with elbows extended, ultimately nerve conduction and EMG showed bilateral median neuropathy at the wrist and right-sided ulnar neuropathy at the elbow, at the last visit about 2 weeks ago I performed an ulnar nerve/cubital tunnel hydrodissection with ultrasound guidance and he returns today with complete resolution of his pain. He does have some discomfort between the shoulder blades in the axilla, I think this is coming from his neck, we will treat him conservatively. Return to see me in 6 weeks.

## 2022-07-05 ENCOUNTER — Ambulatory Visit: Payer: BC Managed Care – PPO | Admitting: Sports Medicine

## 2022-07-24 ENCOUNTER — Encounter: Payer: Self-pay | Admitting: Sports Medicine

## 2022-08-15 ENCOUNTER — Ambulatory Visit: Payer: 59 | Admitting: Sports Medicine

## 2022-08-15 DIAGNOSIS — G5621 Lesion of ulnar nerve, right upper limb: Secondary | ICD-10-CM | POA: Diagnosis not present

## 2022-08-15 MED ORDER — NAPROXEN 500 MG PO TABS: 500.0000 mg | ORAL_TABLET | Freq: Two times a day (BID) | ORAL | 3 refills | Status: DC

## 2022-08-15 NOTE — Progress Notes (Signed)
    Procedures performed today:    None.  Independent interpretation of notes and tests performed by another provider:   None.  Brief History, Exam, Impression, and Recommendations:    Cubital tunnel syndrome, right Joel Perry returns, he is a very pleasant 57 year old male, he has EMG and nerve conduction study confirmed right-sided ulnar neuropathy at the elbow. Chronic right upper extremity paresthesias in an ulnar nerve distribution, he had done months of ulnar nerve gliding exercises, nocturnal elbow extension splinting, we did a cubital tunnel ulnar nerve hydrodissection approximately 2 months ago, he did really well, he still has some symptomatology but overall is livable. He also had some discomfort between the shoulder blades at the last visit, DDD on x-rays, we gave him some cervical conditioning exercises and this is resolved as well. We will continue to watch this for now.    ____________________________________________ Joel Perry. Benjamin Stain, M.D., ABFM., CAQSM., AME. Primary Care and Sports Medicine [facility]  Adjunct Professor of Family Medicine  [place] [employer] [facility] [employer]  Restaurant manager, fast food

## 2022-08-15 NOTE — Assessment & Plan Note (Signed)
Joel Perry returns, he is a very pleasant 57 year old male, he has EMG and nerve conduction study confirmed right-sided ulnar neuropathy at the elbow. Chronic right upper extremity paresthesias in an ulnar nerve distribution, he had done months of ulnar nerve gliding exercises, nocturnal elbow extension splinting, we did a cubital tunnel ulnar nerve hydrodissection approximately 2 months ago, he did really well, he still has some symptomatology but overall is livable. He also had some discomfort between the shoulder blades at the last visit, DDD on x-rays, we gave him some cervical conditioning exercises and this is resolved as well. We will continue to watch this for now.

## 2022-09-07 ENCOUNTER — Telehealth: Payer: Self-pay | Admitting: Sports Medicine

## 2022-09-07 NOTE — Telephone Encounter (Signed)
Patient dropped off a form to be filled out by Dr. Benjamin Stain. Paperwork is in Dr. Melvia Heaps box.

## 2022-09-21 ENCOUNTER — Other Ambulatory Visit: Payer: Self-pay | Admitting: Sports Medicine

## 2022-09-21 DIAGNOSIS — E785 Hyperlipidemia, unspecified: Secondary | ICD-10-CM

## 2022-11-08 ENCOUNTER — Ambulatory Visit: Payer: 59 | Admitting: Sports Medicine

## 2022-11-08 ENCOUNTER — Ambulatory Visit: Payer: 59

## 2022-11-08 ENCOUNTER — Encounter: Payer: Self-pay | Admitting: Sports Medicine

## 2022-11-08 DIAGNOSIS — M545 Low back pain, unspecified: Secondary | ICD-10-CM

## 2022-11-08 DIAGNOSIS — M47816 Spondylosis without myelopathy or radiculopathy, lumbar region: Secondary | ICD-10-CM | POA: Diagnosis not present

## 2022-11-08 MED ORDER — METHOCARBAMOL 500 MG PO TABS
500.0000 mg | ORAL_TABLET | Freq: Three times a day (TID) | ORAL | 0 refills | Status: AC
Start: 2022-11-08 — End: ?

## 2022-11-08 MED ORDER — PREDNISONE 50 MG PO TABS
ORAL_TABLET | ORAL | 0 refills | Status: DC
Start: 2022-11-08 — End: 2023-05-18

## 2022-11-08 NOTE — Assessment & Plan Note (Signed)
Joel Perry is a very pleasant 57 year old male, his chronic axial low back pain, x-rays from a year ago did show lower lumbar facet arthropathy per He did well with conservative treatment, now having recurrence of pain, axial, predominately left-sided, better with sitting, worse with standing with moderate gelling. He also has some pain with Valsalva. On exam he has tenderness left paralumbar. We will treat him conservatively, 5 days of prednisone, due to the paralumbar pain and the likelihood of a muscular component we will also add methocarbamol. Of note he does not plan to use the medications unless the therapy at home fails over a few weeks. Home physical therapy, updated x-rays. Return in 6 weeks, MR for interventional planning if not better.

## 2022-11-08 NOTE — Progress Notes (Signed)
    Procedures performed today:    None.  Independent interpretation of notes and tests performed by another provider:   None.  Brief History, Exam, Impression, and Recommendations:    Lumbar facet arthropathy Joel Perry is a very pleasant 57 year old male, his chronic axial low back pain, x-rays from a year ago did show lower lumbar facet arthropathy per He did well with conservative treatment, now having recurrence of pain, axial, predominately left-sided, better with sitting, worse with standing with moderate gelling. He also has some pain with Valsalva. On exam he has tenderness left paralumbar. We will treat him conservatively, 5 days of prednisone, due to the paralumbar pain and the likelihood of a muscular component we will also add methocarbamol. Of note he does not plan to use the medications unless the therapy at home fails over a few weeks. Home physical therapy, updated x-rays. Return in 6 weeks, MR for interventional planning if not better.    ____________________________________________ Joel Perry. Joel Perry, M.D., ABFM., CAQSM., AME. Primary Care and Sports Medicine [facility]  Adjunct Professor of Family Medicine  [place] [employer] [facility] [employer]  Restaurant manager, fast food

## 2022-12-16 ENCOUNTER — Other Ambulatory Visit: Payer: Self-pay | Admitting: Sports Medicine

## 2022-12-16 DIAGNOSIS — E785 Hyperlipidemia, unspecified: Secondary | ICD-10-CM

## 2022-12-20 ENCOUNTER — Encounter: Payer: Self-pay | Admitting: Sports Medicine

## 2022-12-20 ENCOUNTER — Ambulatory Visit: Payer: 59 | Admitting: Sports Medicine

## 2022-12-20 DIAGNOSIS — M47816 Spondylosis without myelopathy or radiculopathy, lumbar region: Secondary | ICD-10-CM

## 2022-12-20 DIAGNOSIS — R972 Elevated prostate specific antigen [PSA]: Secondary | ICD-10-CM

## 2022-12-20 DIAGNOSIS — Z23 Encounter for immunization: Secondary | ICD-10-CM

## 2022-12-20 DIAGNOSIS — E119 Type 2 diabetes mellitus without complications: Secondary | ICD-10-CM | POA: Diagnosis not present

## 2022-12-20 DIAGNOSIS — Z0000 Encounter for general adult medical examination without abnormal findings: Secondary | ICD-10-CM

## 2022-12-20 NOTE — Assessment & Plan Note (Signed)
Due for some diabetic labs and screenings, ordering now, he will come back tomorrow for labs.

## 2022-12-20 NOTE — Progress Notes (Addendum)
    Procedures performed today:    None.  Independent interpretation of notes and tests performed by another provider:   None.  Brief History, Exam, Impression, and Recommendations:    Lumbar facet arthropathy Joel Perry returns, he is a very pleasant 57 year old male, chronic axial low back pain, x-rays have shown lumbar facet arthropathy. He typically does well with conservative treatment, he did somewhat fall off the habit of doing his home physical therapy, we added some prednisone and additional home PT due to a recurrence of pain. He did not end up taking the prednisone, he worked hard with the physical therapy and returns today pain-free.   I have encouraged him to continue indefinitely.  Diabetes mellitus type 2, controlled, without complications Due for some diabetic labs and screenings, ordering now, he will come back tomorrow for labs.  Annual physical exam Tdap and flu today.  Elevated PSA Mildly elevated PSA with a very low free component, I would like consultation with urology.    ____________________________________________ [name]. Joel Perry, M.D., ABFM., CAQSM., AME. Primary Care and Sports Medicine [facility]  Adjunct Professor of Family Medicine  [place] [employer] [facility] [employer]  Restaurant manager, fast food

## 2022-12-20 NOTE — Addendum Note (Signed)
Addended by: Carren Rang A on: 12/20/2022 08:53 AM   Modules accepted: Orders

## 2022-12-20 NOTE — Assessment & Plan Note (Signed)
Claro returns, he is a very pleasant 57 year old male, chronic axial low back pain, x-rays have shown lumbar facet arthropathy. He typically does well with conservative treatment, he did somewhat fall off the habit of doing his home physical therapy, we added some prednisone and additional home PT due to a recurrence of pain. He did not end up taking the prednisone, he worked hard with the physical therapy and returns today pain-free.   I have encouraged him to continue indefinitely.

## 2022-12-20 NOTE — Assessment & Plan Note (Signed)
Tdap and flu today

## 2022-12-22 DIAGNOSIS — R972 Elevated prostate specific antigen [PSA]: Secondary | ICD-10-CM | POA: Insufficient documentation

## 2022-12-22 LAB — CBC
Hematocrit: 53.5 % — ABNORMAL HIGH (ref 37.5–51.0)
Hemoglobin: 17.2 g/dL (ref 13.0–17.7)
MCH: 27.3 pg (ref 26.6–33.0)
MCHC: 32.1 g/dL (ref 31.5–35.7)
MCV: 85 fL (ref 79–97)
Platelets: 164 10*3/uL (ref 150–450)
RBC: 6.3 x10E6/uL — ABNORMAL HIGH (ref 4.14–5.80)
RDW: 14 % (ref 11.6–15.4)
WBC: 7.1 10*3/uL (ref 3.4–10.8)

## 2022-12-22 LAB — HEMOGLOBIN A1C
Est. average glucose Bld gHb Est-mCnc: 126 mg/dL
Hgb A1c MFr Bld: 6 % — ABNORMAL HIGH (ref 4.8–5.6)

## 2022-12-22 LAB — COMPREHENSIVE METABOLIC PANEL
ALT: 24 [IU]/L (ref 0–44)
AST: 31 [IU]/L (ref 0–40)
Albumin: 4.6 g/dL (ref 3.8–4.9)
Alkaline Phosphatase: 77 [IU]/L (ref 44–121)
BUN/Creatinine Ratio: 13 (ref 9–20)
BUN: 18 mg/dL (ref 6–24)
Bilirubin Total: 0.6 mg/dL (ref 0.0–1.2)
CO2: 23 mmol/L (ref 20–29)
Calcium: 10 mg/dL (ref 8.7–10.2)
Chloride: 99 mmol/L (ref 96–106)
Creatinine, Ser: 1.37 mg/dL — ABNORMAL HIGH (ref 0.76–1.27)
Globulin, Total: 2.8 g/dL (ref 1.5–4.5)
Glucose: 97 mg/dL (ref 70–99)
Potassium: 4.8 mmol/L (ref 3.5–5.2)
Sodium: 139 mmol/L (ref 134–144)
Total Protein: 7.4 g/dL (ref 6.0–8.5)
eGFR: 61 mL/min/{1.73_m2} (ref >59)

## 2022-12-22 LAB — LIPID PANEL
Chol/HDL Ratio: 2.4 {ratio} (ref 0.0–5.0)
Cholesterol, Total: 142 mg/dL (ref 100–199)
HDL: 58 mg/dL (ref >39)
LDL Chol Calc (NIH): 71 mg/dL (ref 0–99)
Triglycerides: 66 mg/dL (ref 0–149)
VLDL Cholesterol Cal: 13 mg/dL (ref 5–40)

## 2022-12-22 LAB — TSH: TSH: 1.82 u[IU]/mL (ref 0.450–4.500)

## 2022-12-22 LAB — PSA, TOTAL AND FREE
PSA, Free Pct: 9.3 %
PSA, Free: 0.42 ng/mL
Prostate Specific Ag, Serum: 4.5 ng/mL — ABNORMAL HIGH (ref 0.0–4.0)

## 2022-12-22 LAB — MICROALBUMIN / CREATININE URINE RATIO
Creatinine, Urine: 175.5 mg/dL
Microalb/Creat Ratio: 3 mg/g{creat} (ref 0–29)
Microalbumin, Urine: 5.8 ug/mL

## 2022-12-22 NOTE — Addendum Note (Signed)
Addended by: Monica Becton on: 12/22/2022 11:01 AM   Modules accepted: Orders

## 2022-12-22 NOTE — Assessment & Plan Note (Signed)
Mildly elevated PSA with a very low free component, I would like consultation with urology.

## 2022-12-24 ENCOUNTER — Encounter: Payer: Self-pay | Admitting: Sports Medicine

## 2022-12-25 ENCOUNTER — Telehealth: Payer: 59 | Admitting: Sports Medicine

## 2022-12-25 DIAGNOSIS — R972 Elevated prostate specific antigen [PSA]: Secondary | ICD-10-CM

## 2022-12-25 NOTE — Telephone Encounter (Signed)
Patient scheduled.

## 2022-12-25 NOTE — Assessment & Plan Note (Signed)
Gradual elevation in PSA, no symptoms, he is on testosterone therapy. Free PSA is low, we will have him touch base with urology.

## 2022-12-25 NOTE — Progress Notes (Signed)
   Virtual Visit via Telephone   I connected with  Joel Perry  on 12/25/22 by telephone/telehealth and verified that I am speaking with the correct person using two identifiers.   I discussed the limitations, risks, security and privacy concerns of performing an evaluation and management service by telephone, including the higher likelihood of inaccurate diagnosis and treatment, and the availability of in person appointments.  We also discussed the likely need of an additional face to face encounter for complete and high quality delivery of care.  I also discussed with the patient that there may be a patient responsible charge related to this service. The patient expressed understanding and wishes to proceed.  Provider location is in medical facility. Patient location is at their home, different from provider location. People involved in care of the patient during this telehealth encounter were myself, my nurse/medical assistant, and my front office/scheduling team member.  Review of Systems: No fevers, chills, night sweats, weight loss, chest pain, or shortness of breath.   Objective Findings:    General: Speaking full sentences, no audible heavy breathing.  Sounds alert and appropriately interactive.    Independent interpretation of tests performed by another provider:   None.  Brief History, Exam, Impression, and Recommendations:    Elevated PSA Gradual elevation in PSA, no symptoms, he is on testosterone therapy. Free PSA is low, we will have him touch base with urology.  I discussed the above assessment and treatment plan with the patient. The patient was provided an opportunity to ask questions and all were answered. The patient agreed with the plan and demonstrated an understanding of the instructions.   The patient was advised to call back or seek an in-person evaluation if the symptoms worsen or if the condition fails to improve as anticipated.   I provided 30 minutes  of verbal and non-verbal time during this encounter date, time was needed to gather information, review chart, records, communicate/coordinate with staff remotely, as well as complete documentation.   ____________________________________________ Joel Perry. Joel Perry, M.D., ABFM., CAQSM., AME. Primary Care and Sports Medicine [facility]  Adjunct Professor of Family Medicine  [place] [employer] [facility] [employer]  Restaurant manager, fast food

## 2023-01-09 ENCOUNTER — Encounter: Payer: Self-pay | Admitting: Urology

## 2023-01-09 ENCOUNTER — Ambulatory Visit (INDEPENDENT_AMBULATORY_CARE_PROVIDER_SITE_OTHER): Payer: 59 | Admitting: Urology

## 2023-01-09 VITALS — BP 112/74 | HR 71 | Ht 68.0 in | Wt 196.0 lb

## 2023-01-09 DIAGNOSIS — R972 Elevated prostate specific antigen [PSA]: Secondary | ICD-10-CM | POA: Diagnosis not present

## 2023-01-09 DIAGNOSIS — Z79890 Hormone replacement therapy (postmenopausal): Secondary | ICD-10-CM

## 2023-01-09 LAB — URINALYSIS, ROUTINE W REFLEX MICROSCOPIC
Bilirubin, UA: NEGATIVE
Glucose, UA: NEGATIVE
Ketones, UA: NEGATIVE
Leukocytes,UA: NEGATIVE
Nitrite, UA: NEGATIVE
Protein,UA: NEGATIVE
RBC, UA: NEGATIVE
Specific Gravity, UA: 1.015 (ref 1.005–1.030)
Urobilinogen, Ur: 0.2 mg/dL (ref 0.2–1.0)
pH, UA: 6 (ref 5.0–7.5)

## 2023-01-09 NOTE — Progress Notes (Signed)
 Assessment: 1. Elevated PSA   2. Long-term current use of testosterone  replacement therapy    Plan: I personally reviewed the patient's chart including provider notes, and lab results. Today I had a long discussion with the patient regarding PSA and the rationale and controversies of prostate cancer early detection.  I discussed the pros and cons of further evaluation including TRUS and prostate Bx.  Potential adverse events and complications as well as standard instructions were given.  Patient expressed his understanding of these issues. I discussed further evaluation of his elevated PSA with MRI of the prostate and prostate biopsy.  I discussed the role of MRI in diagnosis of prostate cancer and potential limitations.  I advised him that a prostate biopsy may still be indicated even in the setting of a normal MRI. Schedule for multiparametric 3T MRI of the prostate. Recommend discontinuing testosterone  therapy until the evaluation of his elevated PSA is completed.  Chief Complaint:  Chief Complaint  Patient presents with   Elevated PSA    History of Present Illness:  Joel Perry is a 58 y.o. male who is seen in consultation from Curtis Debby PARAS, MD for evaluation of elevated PSA.  PSA results: 10/20 2.5  (8% free) 8/21 1.9  (11% free) 8/22 2.1  (10% free) 5/23 2.5  (12% free) 7/23 2.5  (12% free) 7/23 2.7  (15% free) 1/24 3.7  (14% free) 12/24 4.5 (9.3% free)  No prior history of elevated PSA.  No history of UTIs or prostatitis.  No family history of prostate cancer. No dysuria or gross hematuria.  He does report occasional weak stream, primarily in the morning. IPSS = 7.  He is on testosterone  replacement therapy with IM injections of 200 mg every 14 days.  He has been receiving testosterone  injections for approximately 2 years. Last documented testosterone  was 1300 in 7/23. H&H from 12/24:  17.2/53.5 Last injection was given approximately 4 days ago.  He  is also on tadalafil  5 mg daily for erectile dysfunction.  He has noted some improvement in his urinary symptoms with the tadalafil .   Past Medical History:  Past Medical History:  Diagnosis Date   Tenonitis    of AC    Past Surgical History:  Past Surgical History:  Procedure Laterality Date   EYE SURGERY Right     Allergies:  No Known Allergies  Family History:  No family history on file.  Social History:  Social History   Tobacco Use   Smoking status: Never   Smokeless tobacco: Never  Substance Use Topics   Alcohol use: Yes    Alcohol/week: 0.0 standard drinks of alcohol    Comment: rare   Drug use: No    Review of symptoms:  Constitutional:  Negative for unexplained weight loss, night sweats, fever, chills ENT:  Negative for nose bleeds, sinus pain, painful swallowing CV:  Negative for chest pain, shortness of breath, exercise intolerance, palpitations, loss of consciousness Resp:  Negative for cough, wheezing, shortness of breath GI:  Negative for nausea, vomiting, diarrhea, bloody stools GU:  Positives noted in HPI; otherwise negative for gross hematuria, dysuria, urinary incontinence Neuro:  Negative for seizures, poor balance, limb weakness, slurred speech Psych:  Negative for lack of energy, depression, anxiety Endocrine:  Negative for polydipsia, polyuria, symptoms of hypoglycemia (dizziness, hunger, sweating) Hematologic:  Negative for anemia, purpura, petechia, prolonged or excessive bleeding, use of anticoagulants  Allergic:  Negative for difficulty breathing or choking as a result of exposure  to anything; no shellfish allergy; no allergic response (rash/itch) to materials, foods  Physical exam: BP 112/74   Pulse 71   Ht 5' 8 (1.727 m)   Wt 196 lb (88.9 kg)   BMI 29.80 kg/m  GENERAL APPEARANCE:  Well appearing, well developed, well nourished, NAD HEENT: Atraumatic, Normocephalic, oropharynx clear. NECK: Supple without lymphadenopathy or  thyromegaly. LUNGS: Clear to auscultation bilaterally. HEART: Regular Rate and Rhythm without murmurs, gallops, or rubs. ABDOMEN: Soft, non-tender, No Masses. EXTREMITIES: Moves all extremities well.  Without clubbing, cyanosis, or edema. NEUROLOGIC:  Alert and oriented x 3, normal gait, CN II-XII grossly intact.  MENTAL STATUS:  Appropriate. BACK:  Non-tender to palpation.  No CVAT SKIN:  Warm, dry and intact.  GU: Penis:  circumcised Meatus: Normal Scrotum: normal, no masses Testis: small bilaterally, no masses Epididymis: normal Prostate: 40 g, NT, no nodules Rectum: Normal tone,  no masses or tenderness    Results: U/A: negative

## 2023-02-17 ENCOUNTER — Ambulatory Visit
Admission: RE | Admit: 2023-02-17 | Discharge: 2023-02-17 | Disposition: A | Discharge status: Home / Self Care | Payer: 59 | Source: Ambulatory Visit | Attending: Urology | Admitting: Urology

## 2023-02-17 DIAGNOSIS — R972 Elevated prostate specific antigen [PSA]: Secondary | ICD-10-CM

## 2023-02-17 MED ORDER — GADOPICLENOL 0.5 MMOL/ML IV SOLN
9.0000 mL | Freq: Once | INTRAVENOUS | Status: AC | PRN
  Administered 2023-02-17: 9 mL via INTRAVENOUS

## 2023-02-22 ENCOUNTER — Encounter: Payer: Self-pay | Admitting: Urology

## 2023-02-26 ENCOUNTER — Other Ambulatory Visit: Payer: Self-pay | Admitting: Urology

## 2023-02-26 DIAGNOSIS — R972 Elevated prostate specific antigen [PSA]: Secondary | ICD-10-CM

## 2023-03-19 ENCOUNTER — Other Ambulatory Visit: Payer: Self-pay | Admitting: Sports Medicine

## 2023-03-19 DIAGNOSIS — E785 Hyperlipidemia, unspecified: Secondary | ICD-10-CM

## 2023-03-29 ENCOUNTER — Encounter: Payer: Self-pay | Admitting: Urology

## 2023-03-30 ENCOUNTER — Other Ambulatory Visit: Payer: Self-pay | Admitting: Sports Medicine

## 2023-03-30 DIAGNOSIS — N529 Male erectile dysfunction, unspecified: Secondary | ICD-10-CM

## 2023-04-02 ENCOUNTER — Encounter: Payer: Self-pay | Admitting: Sports Medicine

## 2023-04-02 ENCOUNTER — Encounter: Payer: Self-pay | Admitting: Urology

## 2023-04-02 ENCOUNTER — Ambulatory Visit (INDEPENDENT_AMBULATORY_CARE_PROVIDER_SITE_OTHER): Admitting: Urology

## 2023-04-02 VITALS — BP 111/76 | HR 72 | Ht 68.0 in | Wt 195.0 lb

## 2023-04-02 DIAGNOSIS — C61 Malignant neoplasm of prostate: Secondary | ICD-10-CM | POA: Diagnosis not present

## 2023-04-02 DIAGNOSIS — J0140 Acute pansinusitis, unspecified: Secondary | ICD-10-CM

## 2023-04-02 LAB — URINALYSIS, ROUTINE W REFLEX MICROSCOPIC
Bilirubin, UA: NEGATIVE
Glucose, UA: NEGATIVE
Ketones, UA: NEGATIVE
Leukocytes,UA: NEGATIVE
Nitrite, UA: NEGATIVE
Protein,UA: NEGATIVE
Specific Gravity, UA: 1.015 (ref 1.005–1.030)
Urobilinogen, Ur: 0.2 mg/dL (ref 0.2–1.0)
pH, UA: 7 (ref 5.0–7.5)

## 2023-04-02 LAB — MICROSCOPIC EXAMINATION

## 2023-04-02 NOTE — Progress Notes (Signed)
 Assessment: 1. Prostate cancer (HCC); PSA 4.5; GG1; low risk disease     Plan: I spent a total of 40 minutes counseling [name] regarding the diagnosis of localized prostate cancer.  I spent the first 15 minutes discussing the biopsy results.  Using the [facility] prostate cancer nomogram, I cited a probability of 81 percent for localized prostate cancer, 19 percent of extracapsular extension, 1 percent of seminal vesicle involvement, and 1 percent of lymph node involvement.  I discussed the diagnosis of localized prostate cancer in the natural history of prostate cancer. I then spent the next 25 minutes discussing treatment options for localized prostate cancer.  Specifically, I discussed active surveillance, radical prostatectomy (RALP, RRP, RPP), external beam radiation, low dose rate brachytherapy, cryosurgery, and high intensity frequency ultrasound.  I discussed the risk and benefits of each treatment.  I discussed the potential risk of impotence and incontinence as they relate to treatment of prostate cancer.  Questions were answered.  The patient was given literature regarding prostate cancer to review.  Recommend evaluation with genomic testing with Decipher to confirm low risk status and determine best management option.  Chief Complaint:  Chief Complaint  Patient presents with   Prostate Cancer    History of Present Illness:  Joel Perry is a 58 y.o. male who is seen for evaluation of newly diagnosed low risk prostate cancer.  He was  found to have an elevated PSA. PSA results: 10/20 2.5  (8% free) 8/21 1.9  (11% free) 8/22 2.1  (10% free) 5/23 2.5  (12% free) 7/23 2.5  (12% free) 7/23 2.7  (15% free) 1/24 3.7  (14% free) 12/24 4.5 (9.3% free)  No prior history of elevated PSA.  No history of UTIs or prostatitis.  No family history of prostate cancer. No dysuria or gross hematuria.  He does report occasional weak stream, primarily in the morning. IPSS =  7.  He was on testosterone replacement therapy with IM injections of 200 mg every 14 days.  He had been receiving testosterone injections for approximately 2 years. Last documented testosterone was 1300 in 7/23. H&H from 12/24:  17.2/53.5  He is also on tadalafil 5 mg daily for erectile dysfunction.  He has noted some improvement in his urinary symptoms with the tadalafil.  He was evaluated with a prostate MRI on 02/19/23 which showed PI-RADS 3 lesions bilaterally, no adenopathy, volume of 39.5 cc. He underwent fusion guided biopsy on 03/23/23. He returns today following his transrectal ultrasound and biopsy of the prostate. PSA: 4.5 ng/ml TRUS volume:  39.5 ml PSA density:  0.11 Biopsy results:             Gleason score: 3+ 3 = 6            # positive cores: 3/7 on right    3/7 on left            Location of cancer: apex and base, ROI on left  Complications after biopsy: none Patient without significant LUTS.    IPSS = 3 Patient with erectile dysfunction.      Past Medical History:  Past Medical History:  Diagnosis Date   Tenonitis    of AC    Past Surgical History:  Past Surgical History:  Procedure Laterality Date   EYE SURGERY Right     Allergies:  No Known Allergies  Family History:  No family history on file.  Social History:  Social History   Tobacco Use  Smoking status: Never   Smokeless tobacco: Never  Substance Use Topics   Alcohol use: Yes    Alcohol/week: 0.0 standard drinks of alcohol    Comment: rare   Drug use: No    ROS: Constitutional:  Negative for fever, chills, weight loss CV: Negative for chest pain, previous MI, hypertension Respiratory:  Negative for shortness of breath, wheezing, sleep apnea, frequent cough GI:  Negative for nausea, vomiting, bloody stool, GERD  Physical exam: BP 111/76   Pulse 72   Ht 5\' 8"  (1.727 m)   Wt 195 lb (88.5 kg)   BMI 29.65 kg/m  GENERAL APPEARANCE:  Well appearing, well developed, well nourished,  NAD HEENT:  Atraumatic, normocephalic, oropharynx clear NECK:  Supple without lymphadenopathy or thyromegaly ABDOMEN:  Soft, non-tender, no masses EXTREMITIES:  Moves all extremities well, without clubbing, cyanosis, or edema NEUROLOGIC:  Alert and oriented x 3, normal gait, CN II-XII grossly intact MENTAL STATUS:  appropriate BACK:  Non-tender to palpation, No CVAT SKIN:  Warm, dry, and intact    Results: Results for orders placed or performed in visit on 04/02/23 (from the past 24 hours)  Urinalysis, Routine w reflex microscopic     Status: Abnormal   Collection Time: 04/02/23 12:00 AM  Result Value Ref Range   Specific Gravity, UA 1.015 1.005 - 1.030   pH, UA 7.0 5.0 - 7.5   Color, UA Yellow Yellow   Appearance Ur Clear Clear   Leukocytes,UA Negative Negative   Protein,UA Negative Negative/Trace   Glucose, UA Negative Negative   Ketones, UA Negative Negative   RBC, UA 2+ (A) Negative   Bilirubin, UA Negative Negative   Urobilinogen, Ur 0.2 0.2 - 1.0 mg/dL   Nitrite, UA Negative Negative   Microscopic Examination See below:    Narrative   Performed at:  01 - [email or web address]  Urology [facility] [address], [place], [place]  [id number] Lab Director: Corinna Lines MT, Phone:  [phone]  Microscopic Examination     Status: Abnormal   Collection Time: 04/02/23 12:00 AM   Urine  Result Value Ref Range   WBC, UA 0-5 0 - 5 /hpf   RBC, Urine 11-30 (A) 0 - 2 /hpf   Epithelial Cells (non renal) 0-10 0 - 10 /hpf   Bacteria, UA Few None seen/Few   Narrative   Performed at:  01 [facility]  Urology [facility] [address], [place], [place]  [id number] Lab Director: Corinna Lines MT, Phone:  [phone]

## 2023-04-03 LAB — HM DIABETES EYE EXAM

## 2023-04-03 MED ORDER — FLUTICASONE PROPIONATE 50 MCG/ACT NA SUSP: 2.0000 | Freq: Every day | NASAL | 6 refills | Status: AC

## 2023-04-03 NOTE — Telephone Encounter (Signed)
 Requesting rx rf of flonase Last written 04/14/2022 Last OV 12/25/2022 Upcoming appt = none

## 2023-04-09 ENCOUNTER — Ambulatory Visit: Admitting: Urology

## 2023-04-18 ENCOUNTER — Encounter: Payer: Self-pay | Admitting: Urology

## 2023-04-26 ENCOUNTER — Telehealth: Payer: Self-pay | Admitting: Urology

## 2023-04-26 NOTE — Telephone Encounter (Signed)
 Decipher test shows low risk. I discussed these results with the patient by phone today. I again discussed options for management of his low risk prostate cancer including active surveillance, radical prostatectomy, and radiation therapy.  I advised him that given his low risk disease he would be an appropriate candidate for active surveillance but would also have an excellent outcome with definitive therapy. He is interested in active surveillance at this time. I discussed the role of active surveillance for management of low risk and favorable intermediate risk prostate cancer. The advantages of active surveillance were discussed including the following:  - Somewhere between 50% and 68% of eligible patients may safely avoid treatment for at least 10 years - Patient's will avoid possible side effects of definitive therapy that may be unnecessary - Quality of life/normal activities will be less affected while on active surveillance - Risk of unnecessary treatment of small, indolent cancers will be reduced  Limitations of active surveillance discussed including: -Between 32% and 50% of patients will require treatment by 10 years, although treatment delays do not seem to impact cure rate -Although the risk is very low (<0.5% according to most published series), it is possible for cancers progressed to a regional or metastatic stage while on active surveillance  I also discussed that patients who choose active surveillance should have regular follow-up including PSA testing approximately every 6 months, DRE annually, confirmatory biopsy approximately 1 year after initial diagnosis, multiparametric MRI every 12 months or as clinically indicated.   Will make arrangements for follow-up in 3-4 months.

## 2023-04-30 ENCOUNTER — Telehealth: Payer: Self-pay | Admitting: Urology

## 2023-04-30 NOTE — Telephone Encounter (Signed)
 LVM for pt to call back and schedule.

## 2023-04-30 NOTE — Telephone Encounter (Signed)
-----   Message from Oda Bence sent at 04/26/2023  4:25 PM EDT ----- Please schedule a follow-up appointment for 3-4 months.

## 2023-05-18 ENCOUNTER — Ambulatory Visit: Admitting: Sports Medicine

## 2023-05-18 ENCOUNTER — Encounter: Payer: Self-pay | Admitting: Sports Medicine

## 2023-05-18 VITALS — BP 108/74 | HR 69 | Resp 20 | Ht 68.0 in | Wt 198.0 lb

## 2023-05-18 DIAGNOSIS — H8110 Benign paroxysmal vertigo, unspecified ear: Secondary | ICD-10-CM | POA: Diagnosis not present

## 2023-05-18 DIAGNOSIS — E291 Testicular hypofunction: Secondary | ICD-10-CM

## 2023-05-18 MED ORDER — PREDNISONE 50 MG PO TABS
50.0000 mg | ORAL_TABLET | Freq: Every day | ORAL | 0 refills | Status: DC
Start: 2023-05-18 — End: 2023-06-29

## 2023-05-18 MED ORDER — MECLIZINE HCL 25 MG PO TABS: 25.0000 mg | ORAL_TABLET | Freq: Three times a day (TID) | ORAL | 3 refills | Status: AC | PRN

## 2023-05-18 NOTE — Progress Notes (Addendum)
    Procedures performed today:    None.  Independent interpretation of notes and tests performed by another provider:   None.  Brief History, Exam, Impression, and Recommendations:    Benign paroxysmal positional vertigo Very pleasant 58 year old male, he is coming in with complaints of dizziness that he describes as disequilibrium, vertigo, he had this back in 2020 and this feels about the same. Head and neck exam is normal, neurologic exam is normal. We did not do the Dix-Hallpike maneuver today as his symptoms are classic. He responded well to conservative treatment including vestibular physical therapy so we will do this again.   Male hypogonadism Taft is off of the testosterone , no worsening of symptomatology, we will discontinue this for now.  Chronic process with exacerbation and pharmacologic intervention  ____________________________________________ Joselyn Nicely. Sandy Crumb, M.D., ABFM., CAQSM., AME. Primary Care and Sports Medicine [facility]  Adjunct Professor of [employer] Behavioral Health Medicine  [employer]  Restaurant manager, fast food

## 2023-05-18 NOTE — Assessment & Plan Note (Signed)
 Joel Perry is off of the testosterone , no worsening of symptomatology, we will discontinue this for now.

## 2023-05-18 NOTE — Addendum Note (Signed)
 Addended by: Gean Keels on: 05/18/2023 08:41 AM   Modules accepted: Orders

## 2023-05-18 NOTE — Assessment & Plan Note (Signed)
 Very pleasant 58 year old male, he is coming in with complaints of dizziness that he describes as disequilibrium, vertigo, he had this back in 2020 and this feels about the same. Head and neck exam is normal, neurologic exam is normal. We did not do the Dix-Hallpike maneuver today as his symptoms are classic. He responded well to conservative treatment including vestibular physical therapy so we will do this again.

## 2023-06-13 ENCOUNTER — Ambulatory Visit: Admitting: Rehabilitative and Restorative Service Providers"

## 2023-06-14 ENCOUNTER — Other Ambulatory Visit: Payer: Self-pay | Admitting: Sports Medicine

## 2023-06-14 DIAGNOSIS — E785 Hyperlipidemia, unspecified: Secondary | ICD-10-CM

## 2023-06-18 ENCOUNTER — Other Ambulatory Visit: Payer: Self-pay | Admitting: Sports Medicine

## 2023-06-18 DIAGNOSIS — M75101 Unspecified rotator cuff tear or rupture of right shoulder, not specified as traumatic: Secondary | ICD-10-CM

## 2023-06-26 ENCOUNTER — Ambulatory Visit: Admitting: Rehabilitative and Restorative Service Providers"

## 2023-06-29 ENCOUNTER — Ambulatory Visit: Admitting: Sports Medicine

## 2023-06-29 VITALS — BP 104/71 | HR 60 | Wt 202.0 lb

## 2023-06-29 DIAGNOSIS — M1711 Unilateral primary osteoarthritis, right knee: Secondary | ICD-10-CM | POA: Diagnosis not present

## 2023-06-29 DIAGNOSIS — H8110 Benign paroxysmal vertigo, unspecified ear: Secondary | ICD-10-CM | POA: Diagnosis not present

## 2023-06-29 MED ORDER — DICLOFENAC SODIUM 75 MG PO TBEC: 75.0000 mg | DELAYED_RELEASE_TABLET | Freq: Two times a day (BID) | ORAL | 3 refills | Status: AC

## 2023-06-29 NOTE — Progress Notes (Signed)
    Procedures performed today:    None.  Independent interpretation of notes and tests performed by another provider:   None.  Brief History, Exam, Impression, and Recommendations:    Benign paroxysmal positional vertigo Improved dramatically, he does have a vestibular therapy session coming up, if this recurs we can do prednisone , meclizine /Valium, and he can do the Epley maneuver at home.    ____________________________________________ Debby PARAS. Curtis, M.D., ABFM., CAQSM., AME. Primary Care and Sports Medicine [facility]  Adjunct Professor of [facility] Medicine  [employer]  Restaurant manager, fast food

## 2023-06-29 NOTE — Assessment & Plan Note (Signed)
 Improved dramatically, he does have a vestibular therapy session coming up, if this recurs we can do prednisone , meclizine /Valium, and he can do the Epley maneuver at home.

## 2023-07-10 ENCOUNTER — Other Ambulatory Visit: Payer: Self-pay

## 2023-07-10 ENCOUNTER — Encounter: Payer: Self-pay | Admitting: Physical Therapy

## 2023-07-10 ENCOUNTER — Ambulatory Visit: Attending: Sports Medicine | Admitting: Physical Therapy

## 2023-07-10 DIAGNOSIS — H8110 Benign paroxysmal vertigo, unspecified ear: Secondary | ICD-10-CM | POA: Insufficient documentation

## 2023-07-10 DIAGNOSIS — R42 Dizziness and giddiness: Secondary | ICD-10-CM | POA: Insufficient documentation

## 2023-07-10 NOTE — Therapy (Signed)
 OUTPATIENT PHYSICAL THERAPY VESTIBULAR EVALUATION     Patient Name: Joel Perry MRN: [id number] DOB:1965/11/10, 58 y.o., male Today's Date: 07/10/2023  END OF SESSION:  PT End of Session - 07/10/23 0927     Visit Number 1    Number of Visits 8    Date for PT Re-Evaluation 09/04/23    Authorization Type [employer]    PT Start Time 0845    PT Stop Time 0918    PT Time Calculation (min) 33 min    Activity Tolerance Patient tolerated treatment well    Behavior During Therapy [facility] for tasks assessed/performed          Past Medical History:  Diagnosis Date   Tenonitis    of AC   Past Surgical History:  Procedure Laterality Date   EYE SURGERY Right    Patient Active Problem List   Diagnosis Date Noted   Elevated PSA 12/22/2022   DDD (degenerative disc disease), cervical 07/04/2022   Varicose veins of both lower extremities 05/01/2022   Cubital tunnel syndrome, right 10/12/2021   Rash of bilateral axillae 10/12/2021   Acute pain of right wrist 05/12/2021   Arthralgia of left acromioclavicular joint 12/10/2018   Obstructive uropathy 11/08/2018   Erectile dysfunction 06/21/2018   Benign paroxysmal positional vertigo 01/21/2018   Carpal tunnel syndrome, right 09/13/2016   Obesity 05/04/2015   Primary osteoarthritis of right elbow 03/23/2015   Diabetes mellitus type 2, controlled, without complications (HCC) 04/15/2014   Hyperlipidemia LDL goal <100 04/15/2014   Male hypogonadism 04/15/2014   Annual physical exam 03/25/2014   Skin lesion of scalp 03/20/2014   Rotator cuff syndrome of right shoulder 11/08/2012   Osteoarthritis of right knee 08/02/2012   Lumbar facet arthropathy 08/02/2012   GERD 12/10/2008    PCP: Curtis  REFERRING PROVIDER: Curtis  REFERRING DIAG: BPPV  THERAPY DIAG:  Dizziness and giddiness  ONSET DATE: May 2025  Rationale for Evaluation and Treatment: Rehabilitation  SUBJECTIVE:   SUBJECTIVE STATEMENT: Pt states that when he  lays on his Lt side and when he turns his head to the Lt he also gets dizzy. He says this doesn't happen every time, it just comes and goes. Dizziness lasts only a few seconds. No nausea. He states sometimes he gets dizzy when he goes from sitting to standing but this is also intermittent  Pt accompanied by: self  PERTINENT HISTORY: History of BPPV, treated in this clinic. Cervical DDD  PAIN:  Are you having pain? No  PRECAUTIONS: None  RED FLAGS: None   WEIGHT BEARING RESTRICTIONS: No  FALLS: Has patient fallen in last 6 months? No    PLOF: Independent  PATIENT GOALS: reduce episodes of dizziness  OBJECTIVE:  Note: Objective measures were completed at Evaluation unless otherwise noted.    Cervical palpation:  increased mm spasticity Lt > Rt cervical paraspinals      VESTIBULAR ASSESSMENT:     SYMPTOM BEHAVIOR:  Subjective history: see above  Non-Vestibular symptoms: none  Type of dizziness: Imbalance (Disequilibrium)  Frequency: daily  Duration: seconds  Aggravating factors: Induced by position change: rolling to the left  Relieving factors: head stationary  Progression of symptoms: unchanged  OCULOMOTOR EXAM:  Ocular Alignment: normal  Ocular ROM: No Limitations  Spontaneous Nystagmus: absent  Gaze-Induced Nystagmus: absent  Smooth Pursuits: intact  Saccades: intact     VESTIBULAR - OCULAR REFLEX:   Slow VOR: Normal  VOR Cancellation: Normal  Head-Impulse Test: HIT Right: negative HIT Left: negative  POSITIONAL TESTING: Right Dix-Hallpike: no nystagmus Left Dix-Hallpike: no nystagmus Right Roll Test: no nystagmus Left Roll Test: no nystagmus  MOTION SENSITIVITY:  Motion Sensitivity Quotient Intensity: 0 = none, 1 = Lightheaded, 2 = Mild, 3 = Moderate, 4 = Severe, 5 = Vomiting  Intensity  1. Sitting to supine   2. Supine to L side 0  3. Supine to R side 0  4. Supine to sitting   5. L Hallpike-Dix 0  6. Up from L    7. R  Hallpike-Dix 0  8. Up from R    9. Sitting, head tipped to L knee   10. Head up from L knee   11. Sitting, head tipped to R knee   12. Head up from R knee   13. Sitting head turns x5   14.Sitting head nods x5   15. In stance, 180 turn to L    16. In stance, 180 turn to R                                                                                                                                  TREATMENT DATE: 07/10/23  See HEP  PATIENT EDUCATION: Education details: PT POC and goals, HEP Person educated: Patient Education method: Explanation, Demonstration, and Handouts Education comprehension: verbalized understanding and returned demonstration  HOME EXERCISE PROGRAM: Access Code: [email or web address] URL: [email or web address] Date: 07/10/2023 Prepared by: Darice Conine  Exercises - Seated Cervical Retraction  - 3 x daily - 7 x weekly - 1 sets - 10 reps - Gentle Levator Scapulae Stretch  - 3 x daily - 7 x weekly - 1 sets - 10 reps - 10 seconds hold - Seated Gentle Upper Trapezius Stretch  - 3 x daily - 7 x weekly - 1 sets - 10 reps - 10 seconds hold  GOALS: Goals reviewed with patient? Yes   LONG TERM GOALS: Target date: 09/04/2023    Pt will be independent in HEP Baseline:  Goal status: INITIAL  2.  Pt will report symptoms <= 2x a month Baseline:  Goal status: INITIAL    ASSESSMENT:  CLINICAL IMPRESSION: Patient is a 58 y.o. male who was seen today for physical therapy evaluation and treatment for BPPV. His symptoms appear consistent with cervicogenic dizziness as he has mm spasticity throughout cervical paraspinals. He was educated on HEP to reduce muscle spasticity and educated on sitting posture at his computer. Pt with good understanding of recommendations of care and he is to return only if symptoms change or worsen.   OBJECTIVE IMPAIRMENTS: dizziness.   ACTIVITY LIMITATIONS: bed mobility  PARTICIPATION LIMITATIONS: none reported  PERSONAL  FACTORS: 1 comorbidity: cervical DDD are also affecting patient's functional outcome.   REHAB POTENTIAL: Good  CLINICAL DECISION MAKING: Evolving/moderate complexity  EVALUATION COMPLEXITY: Moderate   PLAN:  PT FREQUENCY: as needed  PT DURATION: 8 weeks  PLANNED INTERVENTIONS: 97164- PT Re-evaluation, 97110-Therapeutic exercises, 97530- Therapeutic activity,  02887- Neuromuscular re-education, [phone]- Self Care, 02859- Manual therapy, Patient/Family education, Balance training, and Vestibular training  PLAN FOR NEXT SESSION: assess response to HEP, vestibular treatment as indicated   Wilber Fini, PT 07/10/2023, 9:27 AM

## 2023-08-09 ENCOUNTER — Ambulatory Visit: Admitting: Urology

## 2023-08-17 ENCOUNTER — Encounter: Payer: Self-pay | Admitting: Urology

## 2023-08-17 ENCOUNTER — Ambulatory Visit (INDEPENDENT_AMBULATORY_CARE_PROVIDER_SITE_OTHER): Admitting: Urology

## 2023-08-17 VITALS — BP 109/73 | HR 57 | Ht 68.0 in | Wt 202.0 lb

## 2023-08-17 DIAGNOSIS — C61 Malignant neoplasm of prostate: Secondary | ICD-10-CM

## 2023-08-17 LAB — URINALYSIS, ROUTINE W REFLEX MICROSCOPIC
Bilirubin, UA: NEGATIVE
Glucose, UA: NEGATIVE
Ketones, UA: NEGATIVE
Leukocytes,UA: NEGATIVE
Nitrite, UA: NEGATIVE
Protein,UA: NEGATIVE
RBC, UA: NEGATIVE
Specific Gravity, UA: 1.02 (ref 1.005–1.030)
Urobilinogen, Ur: 0.2 mg/dL (ref 0.2–1.0)
pH, UA: 5.5 (ref 5.0–7.5)

## 2023-08-17 NOTE — Progress Notes (Signed)
 Assessment: 1. Prostate cancer (HCC); PSA 4.5; GG1; low risk disease; on active surveillance     Plan: PSA today I again discussed the role of active surveillance for management of low risk and favorable intermediate risk prostate cancer. The advantages of active surveillance were discussed including the following:  - Somewhere between 50% and 68% of eligible patients may safely avoid treatment for at least 10 years - Patient's will avoid possible side effects of definitive therapy that may be unnecessary - Quality of life/normal activities will be less affected while on active surveillance - Risk of unnecessary treatment of small, indolent cancers will be reduced  Limitations of active surveillance discussed including: -Between 32% and 50% of patients will require treatment by 10 years, although treatment delays do not seem to impact cure rate -Although the risk is very low (<0.5% according to most published series), it is possible for cancers progressed to a regional or metastatic stage while on active surveillance  I also discussed that patients who choose active surveillance should have regular follow-up including PSA testing approximately every 6 months, DRE annually, confirmatory biopsy approximately 1 year after initial diagnosis, multiparametric MRI every 12 months or as clinically indicated. Return to office in 6 months  Chief Complaint:  Chief Complaint  Patient presents with   Prostate Cancer    History of Present Illness:  Joel Perry is a 58 y.o. male who is seen for continued evaluation of  low risk prostate cancer on active surveillance.  He was  found to have an elevated PSA. PSA results: 10/20 2.5  (8% free) 8/21 1.9  (11% free) 8/22 2.1  (10% free) 5/23 2.5  (12% free) 7/23 2.5  (12% free) 7/23 2.7  (15% free) 1/24 3.7  (14% free) 12/24 4.5 (9.3% free)  No prior history of elevated PSA.  No history of UTIs or prostatitis.  No family history of  prostate cancer. No dysuria or gross hematuria.  He does report occasional weak stream, primarily in the morning. IPSS = 7.  He was on testosterone  replacement therapy with IM injections of 200 mg every 14 days.  He had been receiving testosterone  injections for approximately 2 years. Last documented testosterone  was 1300 in 7/23. H&H from 12/24:  17.2/53.5  He is also on tadalafil  5 mg daily for erectile dysfunction.  He has noted some improvement in his urinary symptoms with the tadalafil .  He was evaluated with a prostate MRI on 02/19/23 which showed PI-RADS 3 lesions bilaterally, no adenopathy, volume of 39.5 cc. He underwent fusion guided biopsy on 03/23/23. PSA: 4.5 ng/ml TRUS volume:  39.5 ml PSA density:  0.11 Biopsy results:             Gleason score: 3+ 3 = 6            # positive cores: 3/7 on right    3/7 on left            Location of cancer: apex and base, ROI on left  Decipher:  LOW risk Treatment options for low risk prostate cancer were discussed with the patient in detail.  He elected to proceed with active surveillance.  He returns today for follow-up.  No new lower urinary tract symptoms.  No dysuria or gross hematuria.  No bone pain or weight loss. IPSS = 6/1.  Portions of the above documentation were copied from a prior visit for review purposes only.   Past Medical History:  Past Medical History:  Diagnosis  Date   Tenonitis    of AC    Past Surgical History:  Past Surgical History:  Procedure Laterality Date   EYE SURGERY Right     Allergies:  No Known Allergies  Family History:  No family history on file.  Social History:  Social History   Tobacco Use   Smoking status: Never   Smokeless tobacco: Never  Substance Use Topics   Alcohol use: Yes    Alcohol/week: 0.0 standard drinks of alcohol    Comment: rare   Drug use: No    ROS: Constitutional:  Negative for fever, chills, weight loss CV: Negative for chest pain, previous MI,  hypertension Respiratory:  Negative for shortness of breath, wheezing, sleep apnea, frequent cough GI:  Negative for nausea, vomiting, bloody stool, GERD  Physical exam: BP 109/73   Pulse (!) 57   Ht 5' 8 (1.727 m)   Wt 202 lb (91.6 kg)   BMI 30.71 kg/m  GENERAL APPEARANCE:  Well appearing, well developed, well nourished, NAD HEENT:  Atraumatic, normocephalic, oropharynx clear NECK:  Supple without lymphadenopathy or thyromegaly ABDOMEN:  Soft, non-tender, no masses EXTREMITIES:  Moves all extremities well, without clubbing, cyanosis, or edema NEUROLOGIC:  Alert and oriented x 3, normal gait, CN II-XII grossly intact MENTAL STATUS:  appropriate BACK:  Non-tender to palpation, No CVAT SKIN:  Warm, dry, and intact    Results: U/A: Negative

## 2023-08-18 LAB — PSA: Prostate Specific Ag, Serum: 3.7 ng/mL (ref 0.0–4.0)

## 2023-08-20 ENCOUNTER — Ambulatory Visit: Payer: Self-pay | Admitting: Urology

## 2023-09-04 ENCOUNTER — Encounter: Payer: Self-pay | Admitting: Sports Medicine

## 2023-09-24 ENCOUNTER — Other Ambulatory Visit: Payer: Self-pay

## 2023-09-24 DIAGNOSIS — E785 Hyperlipidemia, unspecified: Secondary | ICD-10-CM

## 2023-09-24 MED ORDER — ROSUVASTATIN CALCIUM 5 MG PO TABS: 5.0000 mg | ORAL_TABLET | Freq: Every day | ORAL | 0 refills | Status: DC

## 2023-10-01 ENCOUNTER — Other Ambulatory Visit: Payer: Self-pay

## 2023-10-01 DIAGNOSIS — N529 Male erectile dysfunction, unspecified: Secondary | ICD-10-CM

## 2023-10-01 MED ORDER — TADALAFIL 10 MG PO TABS: 10.0000 mg | ORAL_TABLET | Freq: Every day | ORAL | 0 refills | Status: DC | PRN

## 2023-12-30 ENCOUNTER — Other Ambulatory Visit: Payer: Self-pay | Admitting: Physician Assistant

## 2023-12-30 DIAGNOSIS — E785 Hyperlipidemia, unspecified: Secondary | ICD-10-CM

## 2024-01-03 IMAGING — DX DG WRIST COMPLETE 3+V*R*
4 series · 4 of 4 positions shown · non-contrast
Comparison: None Available.

CLINICAL DATA: Acute right ulnar-sided wrist pain.

EXAM:
RIGHT WRIST - COMPLETE 3+ VIEW

[wrist pa]
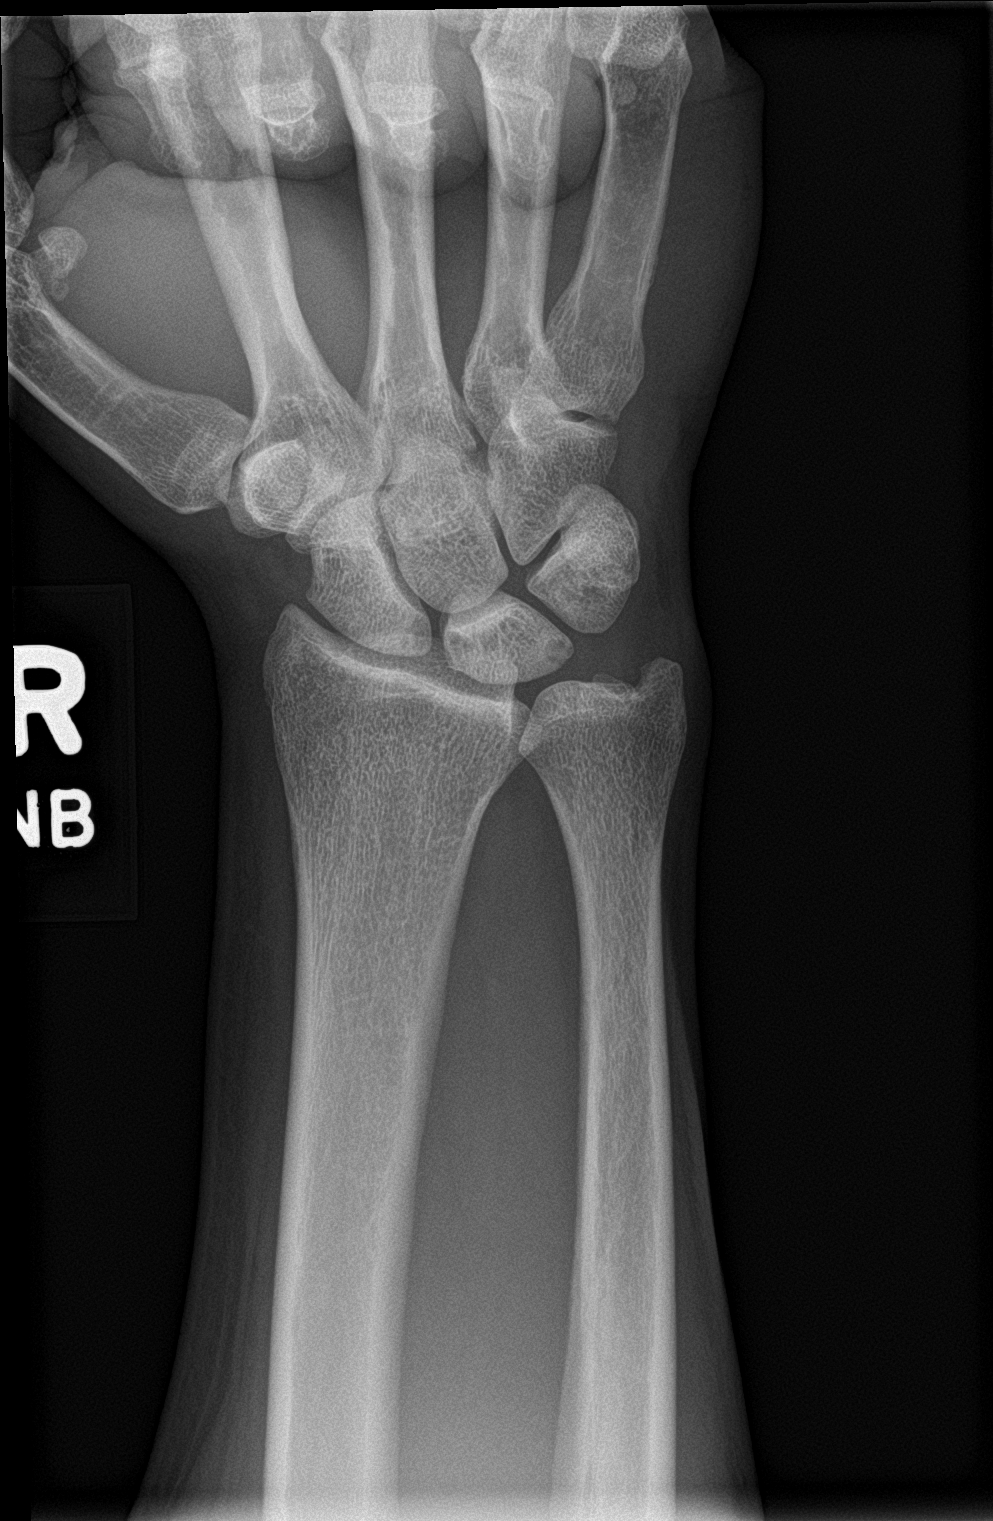

[wrist obl]
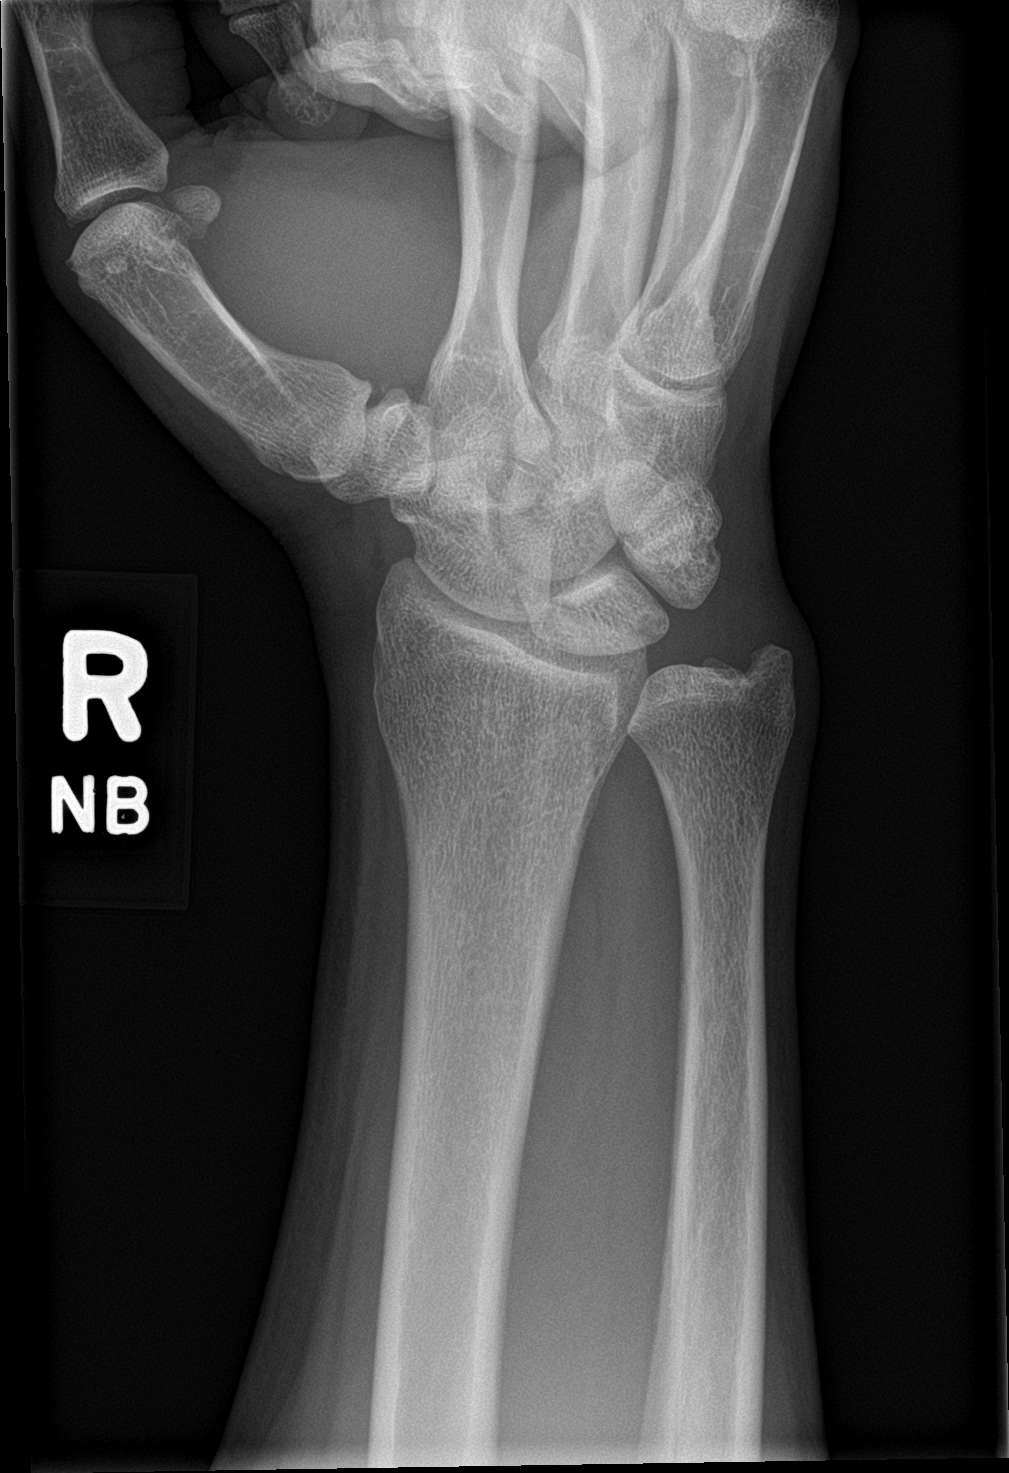

[wrist lat]
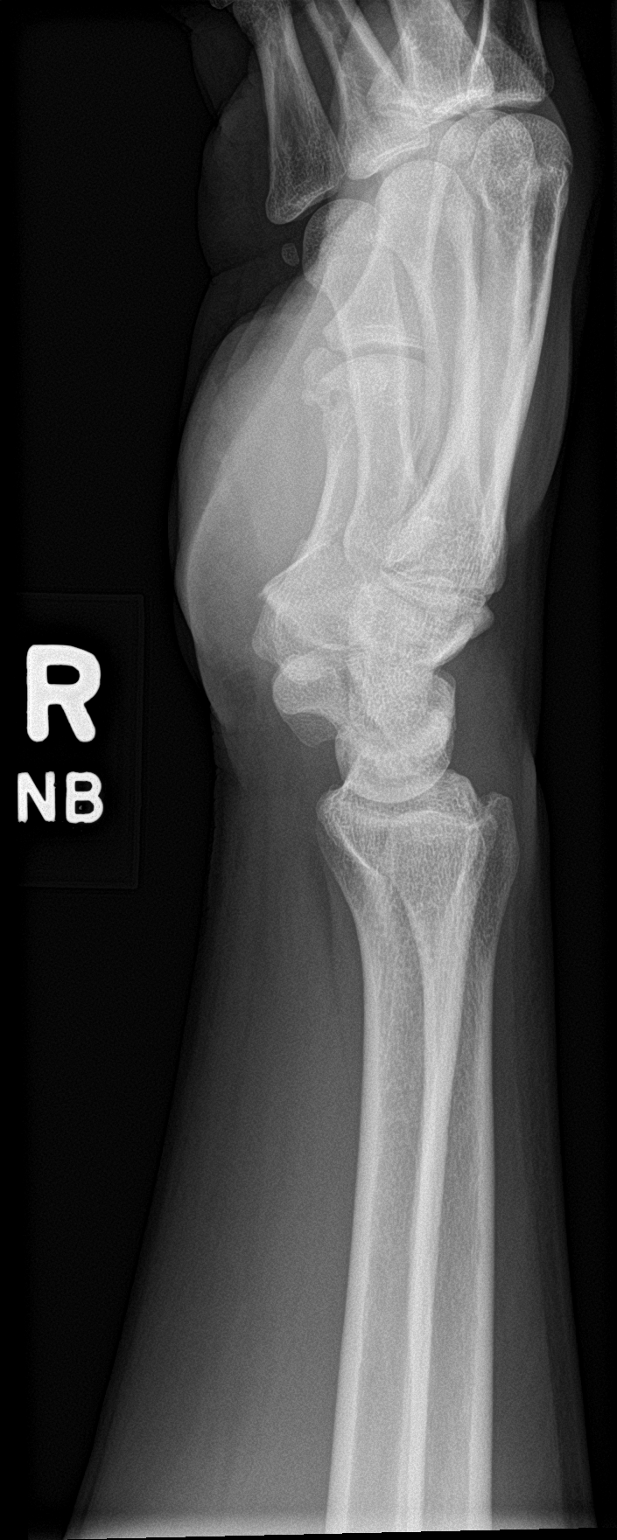

[wrist navicular]
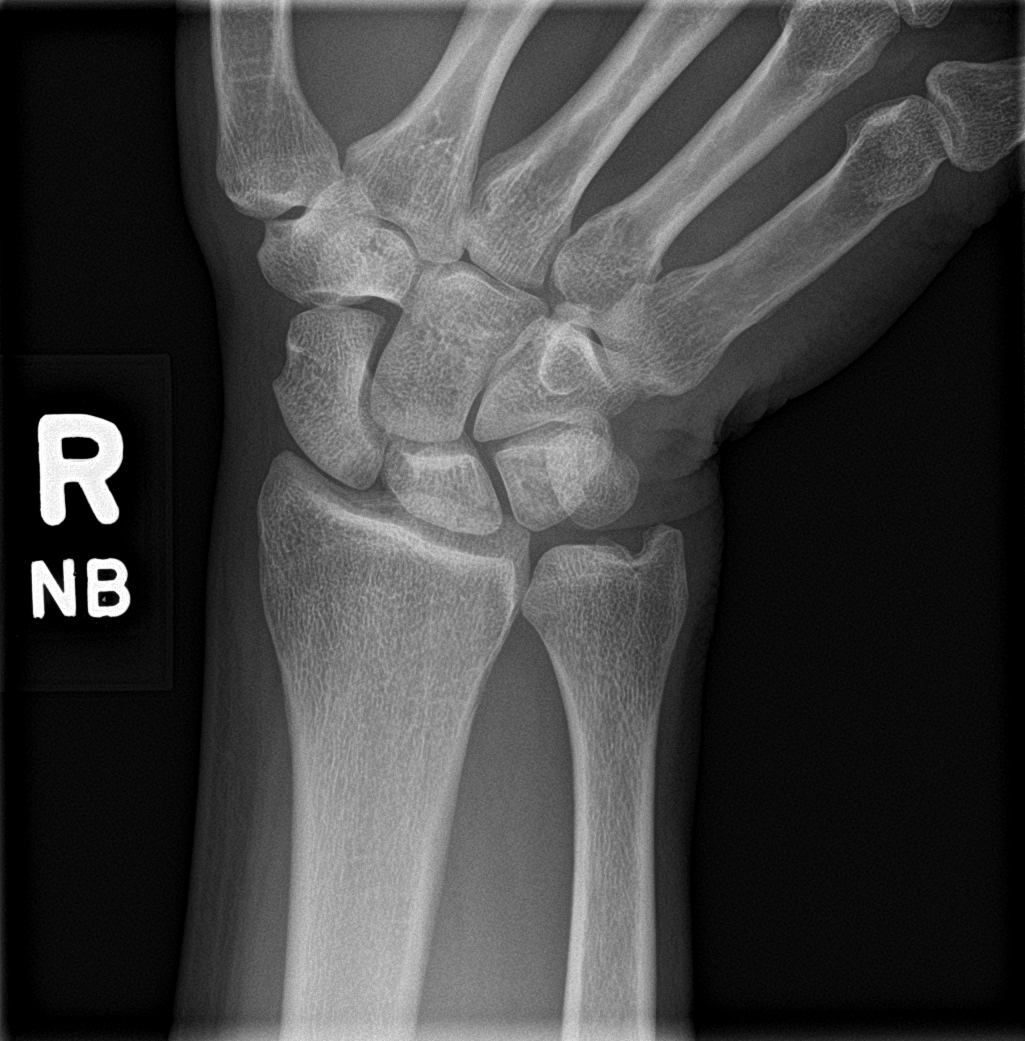

[4 of 4 positions shown; findings below may reference images not displayed]

FINDINGS: No fracture. Small incidental cyst noted in the lunate. No other
bone lesions. Joints are normally spaced and aligned. No
degenerative/arthropathic changes.

Normal soft tissues.
IMPRESSION: Negative.

## 2024-01-23 ENCOUNTER — Other Ambulatory Visit: Payer: Self-pay | Admitting: Family Medicine

## 2024-01-23 DIAGNOSIS — N529 Male erectile dysfunction, unspecified: Secondary | ICD-10-CM

## 2024-01-23 NOTE — Telephone Encounter (Signed)
 Please call pt he will need to establish care for medication refills please

## 2024-01-29 ENCOUNTER — Other Ambulatory Visit: Payer: Self-pay | Admitting: Medical-Surgical

## 2024-01-29 DIAGNOSIS — E785 Hyperlipidemia, unspecified: Secondary | ICD-10-CM

## 2024-01-30 ENCOUNTER — Telehealth: Payer: Self-pay

## 2024-01-30 NOTE — Telephone Encounter (Signed)
 Patient has been contacted and scheduled.

## 2024-01-30 NOTE — Telephone Encounter (Signed)
 Contacted patient and advised he will be needing a TOC appointment in order to receive further refills. Pt stated comprehension. Pt requesting to transfer care to a male provider. I advised pt our office will be returning a call inquiring further details in regards to his request.   Forwarding to Dr. Alvia. Please advise. Thank you.

## 2024-02-01 ENCOUNTER — Telehealth: Payer: Self-pay | Admitting: Sports Medicine

## 2024-02-01 NOTE — Telephone Encounter (Signed)
 The patient is requesting a medication for cough/congestion. The upcoming appointment for 02/04/24 has been cancelled due to the weather. Next appointment is 02/07/24. Please advise, thanks.

## 2024-02-01 NOTE — Telephone Encounter (Signed)
 Pt complains of cough and nasal congestion.  States he had cold times 1 week. Pt wants to know if he can get something called into his pharmacy. Pt uses   [id number] - [address], [place] - [address] Phone: [phone]  Fax: [phone]

## 2024-02-04 ENCOUNTER — Encounter: Admitting: Family Medicine

## 2024-02-04 ENCOUNTER — Telehealth (INDEPENDENT_AMBULATORY_CARE_PROVIDER_SITE_OTHER): Admitting: Family Medicine

## 2024-02-04 ENCOUNTER — Encounter: Payer: Self-pay | Admitting: Family Medicine

## 2024-02-04 DIAGNOSIS — R058 Other specified cough: Secondary | ICD-10-CM | POA: Diagnosis not present

## 2024-02-04 MED ORDER — BENZONATATE 200 MG PO CAPS: 200.0000 mg | ORAL_CAPSULE | Freq: Two times a day (BID) | ORAL | 0 refills | Status: AC | PRN

## 2024-02-04 MED ORDER — HYDROCODONE BIT-HOMATROP MBR 5-1.5 MG/5ML PO SOLN: 5.0000 mL | Freq: Three times a day (TID) | ORAL | 0 refills | Status: AC | PRN

## 2024-02-04 NOTE — Assessment & Plan Note (Signed)
 Majority of symptoms from his recent illness have resolved however continues to have cough.  We discussed strategies to help with this including increased fluids, Mucinex and decongestant to help with drainage.  Will add Tessalon  Perles as well as as Hydromet cough syrup.  Red flags reviewed.

## 2024-02-07 ENCOUNTER — Encounter: Payer: Self-pay | Admitting: Family Medicine

## 2024-02-07 ENCOUNTER — Ambulatory Visit: Admitting: Family Medicine

## 2024-02-07 VITALS — BP 95/61 | HR 89 | Ht 68.0 in | Wt 203.0 lb

## 2024-02-07 DIAGNOSIS — E119 Type 2 diabetes mellitus without complications: Secondary | ICD-10-CM | POA: Diagnosis not present

## 2024-02-07 DIAGNOSIS — N529 Male erectile dysfunction, unspecified: Secondary | ICD-10-CM | POA: Diagnosis not present

## 2024-02-07 DIAGNOSIS — E785 Hyperlipidemia, unspecified: Secondary | ICD-10-CM | POA: Diagnosis not present

## 2024-02-07 MED ORDER — TADALAFIL 10 MG PO TABS: 10.0000 mg | ORAL_TABLET | Freq: Every day | ORAL | 0 refills | Status: AC | PRN

## 2024-02-07 NOTE — Assessment & Plan Note (Signed)
-   Continue tadalafil

## 2024-02-07 NOTE — Assessment & Plan Note (Signed)
 Diet controlled. Update A1c and microalbumin today.

## 2024-02-07 NOTE — Assessment & Plan Note (Signed)
 Continue crestor  and update lipid panel today.

## 2024-02-07 NOTE — Progress Notes (Signed)
 " Joel Perry - 59 y.o. male MRN [id number]  Date of birth: [birth year]  Subjective Chief Complaint  Patient presents with   Diabetes   Hyperlipidemia    HPI Joel Perry is a 59 y.o. male here today for follow up visit.   Recently seen virtually for post-viral cough.  Hydromet cough syrup added. Doing well at this time.   He has history of T2DM that has been well controlled with diet.  He is following a reduced carbohydrate diet and remains moderately active.  He remains on crestor  for associated HLD.   ROS:  A comprehensive ROS was completed and negative except as noted per HPI    Allergies[1]  Past Medical History:  Diagnosis Date   Tenonitis    of AC    Past Surgical History:  Procedure Laterality Date   EYE SURGERY Right     Social History   Socioeconomic History   Marital status: Married    Spouse name: Niels   Number of children: 2   Years of education: Not on file   Highest education level: Bachelor's degree (e.g., BA, AB, BS)  Occupational History   Occupation: Producer, Television/film/video    Comment: [employer].   Tobacco Use   Smoking status: Never   Smokeless tobacco: Never  Substance and Sexual Activity   Alcohol use: Yes    Alcohol/week: 0.0 standard drinks of alcohol    Comment: rare   Drug use: No   Sexual activity: Not on file  Other Topics Concern   Not on file  Social History Narrative   Works out regularly every other day.  Some caffeine.     Social Drivers of Health   Tobacco Use: Low Risk (02/07/2024)   Patient History    Smoking Tobacco Use: Never    Smokeless Tobacco Use: Never    Passive Exposure: Not on file  Financial Resource Strain: Low Risk (01/31/2024)   Overall Financial Resource Strain (CARDIA)    Difficulty of Paying Living Expenses: Not hard at all  Food Insecurity: No Food Insecurity (01/31/2024)   Epic    Worried About Programme Researcher, Broadcasting/film/video in the Last Year: Never true    Ran Out of Food in the Last Year: Never true   Transportation Needs: No Transportation Needs (01/31/2024)   Epic    Lack of Transportation (Medical): No    Lack of Transportation (Non-Medical): No  Physical Activity: Sufficiently Active (01/31/2024)   Exercise Vital Sign    Days of Exercise per Week: 5 days    Minutes of Exercise per Session: 60 min  Stress: No Stress Concern Present (01/31/2024)   [employer] of Occupational Health - Occupational Stress Questionnaire    Feeling of Stress: Not at all  Social Connections: Moderately Integrated (01/31/2024)   Social Connection and Isolation Panel    Frequency of Communication with Friends and Family: Three times a week    Frequency of Social Gatherings with Friends and Family: More than three times a week    Attends Religious Services: 1 to 4 times per year    Active Member of Clubs or Organizations: No    Attends Banker Meetings: Not on file    Marital Status: Married  Depression (PHQ2-9): Low Risk (02/07/2024)   Depression (PHQ2-9)    PHQ-2 Score: 0  Alcohol Screen: Low Risk (01/31/2024)   Alcohol Screen    Last Alcohol Screening Score (AUDIT): 1  Housing: Low Risk (01/31/2024)   Epic  Unable to Pay for Housing in the Last Year: No    Number of Times Moved in the Last Year: 0    Homeless in the Last Year: No  Utilities: Not on file  Health Literacy: Not on file    History reviewed. No pertinent family history.  Health Maintenance  Topic Date Due   Hepatitis B Vaccines 19-59 Average Risk (1 of 3 - 19+ 3-dose series) Never done   Pneumococcal Vaccine: 50+ Years (2 of 2 - PCV) 11/04/2015   HEMOGLOBIN A1C  06/21/2023   Diabetic kidney evaluation - eGFR measurement  12/21/2023   Diabetic kidney evaluation - Urine ACR  12/21/2023   COVID-19 Vaccine (3 - Pfizer risk series) 02/23/2024 (Originally 04/28/2019)   Influenza Vaccine  04/01/2024 (Originally 08/03/2023)   Fecal DNA (Cologuard)  03/22/2024   OPHTHALMOLOGY EXAM  04/02/2024   FOOT EXAM  02/06/2025    DTaP/Tdap/Td (3 - Td or Tdap) 12/19/2032   HPV VACCINES (No Doses Required) Completed   Hepatitis C Screening  Completed   HIV Screening  Completed   Zoster Vaccines- Shingrix   Completed   Meningococcal B Vaccine  Aged Out     ----------------------------------------------------------------------------------------------------------------------------------------------------------------------------------------------------------------- Physical Exam BP 95/61   Pulse 89   Ht 5' 8 (1.727 m)   Wt 203 lb (92.1 kg)   SpO2 100%   BMI 30.87 kg/m   Physical Exam Constitutional:      Appearance: Normal appearance.  Eyes:     General: No scleral icterus. Cardiovascular:     Rate and Rhythm: Normal rate and regular rhythm.  Pulmonary:     Effort: Pulmonary effort is normal.     Breath sounds: Normal breath sounds.  Neurological:     Mental Status: He is alert.  Psychiatric:        Mood and Affect: Mood normal.        Behavior: Behavior normal.     ------------------------------------------------------------------------------------------------------------------------------------------------------------------------------------------------------------------- Assessment and Plan  Diabetes mellitus type 2, controlled, without complications (HCC) Diet controlled. Update A1c and microalbumin today.    Hyperlipidemia LDL goal <100 Continue crestor  and update lipid panel today.   Erectile dysfunction Continue tadalafil .     Meds ordered this encounter  Medications   tadalafil  (CIALIS ) 10 MG tablet    Sig: Take 1 tablet (10 mg total) by mouth daily as needed for erectile dysfunction.    Dispense:  60 tablet    Refill:  0    Return in about 6 months (around 08/06/2024) for Type 2 Diabetes.        [1] No Known Allergies  "

## 2024-02-08 LAB — CMP14+EGFR
ALT: 49 [IU]/L — ABNORMAL HIGH (ref 0–44)
AST: 37 [IU]/L (ref 0–40)
Albumin: 4.3 g/dL (ref 3.8–4.9)
Alkaline Phosphatase: 151 [IU]/L — ABNORMAL HIGH (ref 47–123)
BUN/Creatinine Ratio: 11 (ref 9–20)
BUN: 15 mg/dL (ref 6–24)
Bilirubin Total: 0.3 mg/dL (ref 0.0–1.2)
CO2: 25 mmol/L (ref 20–29)
Calcium: 10.2 mg/dL (ref 8.7–10.2)
Chloride: 100 mmol/L (ref 96–106)
Creatinine, Ser: 1.32 mg/dL — ABNORMAL HIGH (ref 0.76–1.27)
Globulin, Total: 3.3 g/dL (ref 1.5–4.5)
Glucose: 92 mg/dL (ref 70–99)
Potassium: 4.4 mmol/L (ref 3.5–5.2)
Sodium: 139 mmol/L (ref 134–144)
Total Protein: 7.6 g/dL (ref 6.0–8.5)
eGFR: 63 mL/min/{1.73_m2}

## 2024-02-08 LAB — LIPID PANEL WITH LDL/HDL RATIO
Cholesterol, Total: 124 mg/dL (ref 100–199)
HDL: 43 mg/dL
LDL Chol Calc (NIH): 65 mg/dL (ref 0–99)
LDL/HDL Ratio: 1.5 ratio (ref 0.0–3.6)
Triglycerides: 79 mg/dL (ref 0–149)
VLDL Cholesterol Cal: 16 mg/dL (ref 5–40)

## 2024-02-08 LAB — CBC WITH DIFFERENTIAL/PLATELET
Basophils Absolute: 0 10*3/uL (ref 0.0–0.2)
Basos: 0 %
EOS (ABSOLUTE): 0.1 10*3/uL (ref 0.0–0.4)
Eos: 1 %
Hematocrit: 46.8 % (ref 37.5–51.0)
Hemoglobin: 14.9 g/dL (ref 13.0–17.7)
Immature Grans (Abs): 0 10*3/uL (ref 0.0–0.1)
Immature Granulocytes: 0 %
Lymphocytes Absolute: 1.9 10*3/uL (ref 0.7–3.1)
Lymphs: 17 %
MCH: 26.4 pg — ABNORMAL LOW (ref 26.6–33.0)
MCHC: 31.8 g/dL (ref 31.5–35.7)
MCV: 83 fL (ref 79–97)
Monocytes Absolute: 0.7 10*3/uL (ref 0.1–0.9)
Monocytes: 6 %
Neutrophils Absolute: 8.8 10*3/uL — ABNORMAL HIGH (ref 1.4–7.0)
Neutrophils: 76 %
Platelets: 314 10*3/uL (ref 150–450)
RBC: 5.64 x10E6/uL (ref 4.14–5.80)
RDW: 13.4 % (ref 11.6–15.4)
WBC: 11.5 10*3/uL — ABNORMAL HIGH (ref 3.4–10.8)

## 2024-02-08 LAB — MICROALBUMIN / CREATININE URINE RATIO
Creatinine, Urine: 323.2 mg/dL
Microalb/Creat Ratio: 4 mg/g{creat} (ref 0–29)
Microalbumin, Urine: 12 ug/mL

## 2024-02-08 LAB — SPECIMEN STATUS REPORT

## 2024-02-08 LAB — HEMOGLOBIN A1C
Est. average glucose Bld gHb Est-mCnc: 128 mg/dL
Hgb A1c MFr Bld: 6.1 % — ABNORMAL HIGH (ref 4.8–5.6)

## 2024-02-08 LAB — TSH: TSH: 2.54 u[IU]/mL (ref 0.450–4.500)

## 2024-02-14 ENCOUNTER — Ambulatory Visit: Admitting: Urology

## 2024-08-06 ENCOUNTER — Ambulatory Visit: Admitting: Family Medicine
